# Patient Record
Sex: Male | Born: 1945 | Race: Black or African American | Hispanic: No | Marital: Single | Smoking: Current every day smoker
Health system: Southern US, Community
[De-identification: ages and names within clinical notes are randomized; demographics above are authoritative.]

## PROBLEM LIST (undated history)

## (undated) DIAGNOSIS — E119 Type 2 diabetes mellitus without complications: Secondary | ICD-10-CM

## (undated) DIAGNOSIS — D509 Iron deficiency anemia, unspecified: Secondary | ICD-10-CM

## (undated) DIAGNOSIS — R066 Hiccough: Principal | ICD-10-CM

## (undated) DIAGNOSIS — C801 Malignant (primary) neoplasm, unspecified: Secondary | ICD-10-CM

## (undated) DIAGNOSIS — D759 Disease of blood and blood-forming organs, unspecified: Secondary | ICD-10-CM

## (undated) DIAGNOSIS — I10 Essential (primary) hypertension: Secondary | ICD-10-CM

## (undated) DIAGNOSIS — I8290 Acute embolism and thrombosis of unspecified vein: Secondary | ICD-10-CM

## (undated) HISTORY — PX: OTHER SURGICAL HISTORY: SHX169

## (undated) HISTORY — DX: Iron deficiency anemia, unspecified: D50.9

## (undated) HISTORY — DX: Hiccough: R06.6

---

## 2014-01-30 ENCOUNTER — Emergency Department: Payer: Self-pay | Admitting: Internal Medicine

## 2014-01-30 ENCOUNTER — Inpatient Hospital Stay: Payer: Self-pay | Admitting: Internal Medicine

## 2014-01-30 LAB — URINALYSIS, COMPLETE
BACTERIA: NONE SEEN
BILIRUBIN, UR: NEGATIVE
BLOOD: NEGATIVE
KETONE: NEGATIVE
Leukocyte Esterase: NEGATIVE
Nitrite: NEGATIVE
PROTEIN: NEGATIVE
Ph: 6 (ref 4.5–8.0)
SPECIFIC GRAVITY: 1.01 (ref 1.003–1.030)
Squamous Epithelial: NONE SEEN
WBC UR: 1 /HPF (ref 0–5)

## 2014-01-30 LAB — COMPREHENSIVE METABOLIC PANEL
ALT: 17 U/L (ref 12–78)
ANION GAP: 3 — AB (ref 7–16)
Albumin: 3.7 g/dL (ref 3.4–5.0)
Alkaline Phosphatase: 92 U/L
BILIRUBIN TOTAL: 0.2 mg/dL (ref 0.2–1.0)
BUN: 14 mg/dL (ref 7–18)
CALCIUM: 9.1 mg/dL (ref 8.5–10.1)
Chloride: 102 mmol/L (ref 98–107)
Co2: 28 mmol/L (ref 21–32)
Creatinine: 0.88 mg/dL (ref 0.60–1.30)
EGFR (Non-African Amer.): >60
GLUCOSE: 164 mg/dL — AB (ref 65–99)
Osmolality: 270 (ref 275–301)
Potassium: 4.2 mmol/L (ref 3.5–5.1)
SGOT(AST): 18 U/L (ref 15–37)
Sodium: 133 mmol/L — ABNORMAL LOW (ref 136–145)
TOTAL PROTEIN: 7.7 g/dL (ref 6.4–8.2)

## 2014-01-30 LAB — CBC
HCT: 47.5 % (ref 40.0–52.0)
HGB: 15.3 g/dL (ref 13.0–18.0)
MCH: 25.7 pg — AB (ref 26.0–34.0)
MCHC: 32.3 g/dL (ref 32.0–36.0)
MCV: 80 fL (ref 80–100)
Platelet: 236 10*3/uL (ref 150–440)
RBC: 5.96 10*6/uL — AB (ref 4.40–5.90)
RDW: 14.8 % — AB (ref 11.5–14.5)
WBC: 7.1 10*3/uL (ref 3.8–10.6)

## 2014-01-30 LAB — LIPID PANEL
Cholesterol: 193 mg/dL (ref 0–200)
HDL: 26 mg/dL — AB (ref 40–60)
LDL CHOLESTEROL, CALC: 125 mg/dL — AB (ref 0–100)
Triglycerides: 211 mg/dL — ABNORMAL HIGH (ref 0–200)
VLDL Cholesterol, Calc: 42 mg/dL — ABNORMAL HIGH (ref 5–40)

## 2014-01-30 LAB — TROPONIN I: Troponin-I: 0.05 ng/mL

## 2014-01-31 LAB — CBC WITH DIFFERENTIAL/PLATELET
Basophil #: 0.1 10*3/uL (ref 0.0–0.1)
Basophil %: 1.3 %
EOS ABS: 0.6 10*3/uL (ref 0.0–0.7)
EOS PCT: 7.1 %
HCT: 44.9 % (ref 40.0–52.0)
HGB: 14.5 g/dL (ref 13.0–18.0)
LYMPHS ABS: 2.2 10*3/uL (ref 1.0–3.6)
Lymphocyte %: 26.7 %
MCH: 25.7 pg — ABNORMAL LOW (ref 26.0–34.0)
MCHC: 32.3 g/dL (ref 32.0–36.0)
MCV: 80 fL (ref 80–100)
MONO ABS: 0.7 x10 3/mm (ref 0.2–1.0)
MONOS PCT: 8.1 %
NEUTROS ABS: 4.8 10*3/uL (ref 1.4–6.5)
Neutrophil %: 56.8 %
PLATELETS: 238 10*3/uL (ref 150–440)
RBC: 5.65 10*6/uL (ref 4.40–5.90)
RDW: 15.1 % — ABNORMAL HIGH (ref 11.5–14.5)
WBC: 8.4 10*3/uL (ref 3.8–10.6)

## 2014-01-31 LAB — BASIC METABOLIC PANEL
Anion Gap: 5 — ABNORMAL LOW (ref 7–16)
BUN: 16 mg/dL (ref 7–18)
CO2: 27 mmol/L (ref 21–32)
Calcium, Total: 8.9 mg/dL (ref 8.5–10.1)
Chloride: 103 mmol/L (ref 98–107)
Creatinine: 0.9 mg/dL (ref 0.60–1.30)
EGFR (African American): >60
EGFR (Non-African Amer.): >60
Glucose: 101 mg/dL — ABNORMAL HIGH (ref 65–99)
OSMOLALITY: 271 (ref 275–301)
Potassium: 3.5 mmol/L (ref 3.5–5.1)
Sodium: 135 mmol/L — ABNORMAL LOW (ref 136–145)

## 2014-01-31 LAB — LIPID PANEL
CHOLESTEROL: 174 mg/dL (ref 0–200)
HDL Cholesterol: 31 mg/dL — ABNORMAL LOW (ref 40–60)
LDL CHOLESTEROL, CALC: 114 mg/dL — AB (ref 0–100)
TRIGLYCERIDES: 144 mg/dL (ref 0–200)
VLDL CHOLESTEROL, CALC: 29 mg/dL (ref 5–40)

## 2014-01-31 LAB — HEMOGLOBIN A1C: HEMOGLOBIN A1C: 8.2 % — AB (ref 4.2–6.3)

## 2014-01-31 LAB — TSH: Thyroid Stimulating Horm: 0.085 u[IU]/mL — ABNORMAL LOW

## 2015-03-05 NOTE — H&P (Signed)
PATIENT NAME:  Nathaniel Graham, Nathaniel Graham MR#:  [id number] DATE OF BIRTH:  1946-08-25  DATE OF ADMISSION:  01/30/2014  ADDENDUM:  I did dictate this afternoon an initial consultation for Mr. Jakarius Flamenco, BO#[id number]. The patient presented with blurred vision, elevated blood pressure yesterday at the drugstore. He actually left AMA. The patient left AMA due to some personal situation. He is now back and asking for admission. The patient had a CT scan which was concerning for an acute CVA versus some other neurological processes though.   ASSESSMENT AND PLAN: As follows:  A 69 year old male with history of hypertension and diabetes who is not taking any medications for the past 18 months as well as tobacco dependence who is not interested in quitting smoking  who presented initially with blurred vision and elevated blood pressure here with an abnormal CT scan of the head.   1. Acute cerebrovascular accident. The patient's may have had an acute CVA. His blood pressure apparently was over 585 systolic yesterday, 1 day prior to admission and he complains of blurred vision. CT scan is concerning for possible acute  CVA. I have ordered an MRI, carotid Dopplers, and echocardiogram, neurochecks every 4 hours, aspirin and statin, and lipid panel. If necessary, we may consider neurology consultation, but, I rather have the MRI before consulting neurology.  2. Hypertension. We will allow permissive hypertension at this time due to acute CVA. We will go ahead write hydralazine as needed.  3. Diabetes. The patient is not taking any medications for over 18 months. I have ordered an A1c sliding-scale insulin. The patient will need diabetes medication I am sure of it prior to discharge.  4. Tobacco dependence. The patient was already counseled when I saw him initially.  5. The patient will need a PCP prior to discharge.   TIME SPENT: Again on this plan of care was about 30 minutes.     ____________________________ Donell Beers. Benjie Karvonen, MD spm:[id number] D: 01/30/2014 20:16:44 ET T: 01/30/2014 20:45:38 ET JOB#: [id number]  cc: Zyonna Vardaman P. Benjie Karvonen, MD, <Dictator> Donell Beers Tula Schryver MD ELECTRONICALLY SIGNED 01/31/2014 12:21

## 2015-03-05 NOTE — Consult Note (Signed)
PATIENT NAME:  Nathaniel Graham, Nathaniel Graham MR#:  [id number] DATE OF BIRTH:  Aug 19, 1946  DATE OF CONSULTATION:  01/30/2014  CONSULTING PHYSICIAN:  Jhase Creppel P. Benjie Karvonen, MD  ADDENDUM The patient does smoke cigarettes, 1 pack a day. The patient was counseled against smoking tobacco. The patient says that he is not going to quit smoking. He is not interested in quitting smoking. The patient was counseled for 3 minutes.    ____________________________ Donell Beers. Benjie Karvonen, MD spm:mr D: 01/30/2014 16:52:54 ET T: 01/30/2014 19:51:04 ET JOB#: [id number]  cc: Yesena Reaves P. Benjie Karvonen, MD, <Dictator> Donell Beers Jatziry Wechter MD ELECTRONICALLY SIGNED 01/30/2014 20:33

## 2015-03-05 NOTE — Discharge Summary (Signed)
Dates of Admission and Diagnosis:  Date of Admission 30-Jan-2014   Date of Discharge 31-Jan-2014   Admitting Diagnosis CVA   Final Diagnosis CVA Htn DM Hyperlipidemia Smoking    Chief Complaint/History of Present Illness a 69 year old male who took his blood pressure yesterday at a pharmacy. Blood pressure was systolic over 330, and he had blurred vision and was feeling lightheaded so came here for further evaluation. In the ER, his blood pressure actually was 118/54. The patient, since his symptoms are improving, he wants to leave [place].   he came back to ER later same day- to get admitted.   Allergies:  No Known Allergies:   Thyroid:  22-Mar-15 03:44   Thyroid Stimulating Hormone  0.085 (0.45-4.50 (International Unit)  ----------------------- Pregnant patients have  different reference  ranges for TSH:  - - - - - - - - - -  Pregnant, first trimetser:  0.36 - 2.50 uIU/mL)  Cardiology:  22-Mar-15 08:44   Echo Doppler REASON FOR EXAM:     COMMENTS:     PROCEDURE: [place] - ECHO DOPPLER COMPLETE(TRANSTHOR)  - Jan 31 2014  8:44AM   RESULT: Echocardiogram Report  Patient Name:   Nathaniel Graham Date of Exam: 01/31/2014 Medical Rec #:  [id number]  Custom1: Date of Birth:  [birth year]               Height:       63.0 in Patient Age:    43 years                Weight:       162.0 lb Patient Gender: M                       BSA:          1.77 m??  Indications: CVA Sonographer:    LTM Referring Phys: MODY, SITAL, P  Sonographer Comments: Poor parasternal views due to patient's smoking  history.  Summary:  1. Left ventricular ejection fraction, by visual estimation, is 70 to  75%.No intracardiac thrombii seen.  2. Elevated mean left atrial pressure.  3. Impaired relaxation pattern of LV diastolic filling.  4. Normal right ventricular size and systolic function.  5. The left atrium is normal in size and structure.  6. The right atrium is normal in size  and structure.  7. Mild mitral valve regurgitation.  8. Mild to moderate aortic regurgitation.  9. Mild to moderate aortic valve sclerosis/calcification without any  evidence of aortic stenosis. 10. Mildly increased left ventricular posterior wall thickness. 2D AND M-MODE MEASUREMENTS (normal ranges within parentheses): Left Ventricle:          Normal   AoV Cusp Separation: 1.50 cm (1.5-2.6) IVSd (2D):      1.29 cm (0.7-1.1) LVPWd (2D):     1.26 cm (0.7-1.1) Aorta/LA:                  Normal LVIDd (2D):     3.69 cm (3.4-5.7) Aortic Root (2D): 2.80 cm (2.4-3.7) LVIDs (2D):     2.13 cm           AoV Cusp Exc:     1.50 cm (1.5-2.6) LV FS (2D):     42.3 %   (>25%) LV EF (2D):     74.1 %   (>50%)  Right Ventricle:                          RVd (2D): LV DIASTOLIC FUNCTION: MV Peak E: 0.74 m/s E/e' Ratio: 6.80 MV Peak A: 1.31 m/s Decel Time: 217 msec E/A Ratio: 0.57 SPECTRAL DOPPLER ANALYSIS (where applicable): Mitral Valve: MV Max Vel:   1.32 m/s MV P1/2 Time: 62.93 msec MV Mean Grad: 3.0 mmHg MV Area, PHT: 3.50 cm?? Aortic Valve: AoV Max Vel: 1.38 m/s AoV Peak PG: 7.6 mmHg AoV Mean PG: LVOT Vmax: 0.74 m/s LVOT VTI:  LVOT Diameter: 1.90 cm AoV Area, Vmax: 1.53 cm?? AoV Area, VTI:  AoV Area, Vmn: Pulmonic Valve: PV Max Velocity: 0.95 m/s PV Max PG: 3.6 mmHg PV Mean PG:  PHYSICIAN INTERPRETATION: Left Ventricle: The left ventricular internal cavity size was normal. LV  posterior wall thickness was mildly increased. Left ventricular ejection   fraction, by visual estimation, is 70 to 75%. Spectral Doppler shows  impaired relaxation pattern of LV diastolic filling. Elevated mean left  atrial pressure. Right Ventricle: Normal right ventricular size, wall thickness, and  systolic function. Left Atrium: The left atrium is normal in size and structure. Right Atrium: The right atrium is normal in size and structure. Mitral Valve: Mild mitral valve  regurgitation is seen. Tricuspid Valve: Trivial tricuspid regurgitation is visualized. Aortic Valve: Mild to moderate aortic valve sclerosis/calcification is  present, without any evidence of aortic stenosis. Mild to moderate aortic  valve regurgitation is seen.  [place] MD Electronically signed by [id number] [facility] Signature Date/Time: 01/31/2014/11:00:45 AM  *** Final ***  IMPRESSION: .    Verified By: Emmit Pomfret. Humphrey Rolls, M.D., MD  Routine Chem:  22-Mar-15 03:44   Glucose, Serum  101  BUN 16  Creatinine (comp) 0.90  Sodium, Serum  135  Potassium, Serum 3.5  Chloride, Serum 103  CO2, Serum 27  Calcium (Total), Serum 8.9  Anion Gap  5  Osmolality (calc) 271  eGFR (African American) >60  eGFR (Non-African American) >60 (eGFR values <47m/min/1.73 m2 may be an indication of chronic kidney disease (CKD). Calculated eGFR is useful in patients with stable renal function. The eGFR calculation will not be reliable in acutely ill patients when serum creatinine is changing rapidly. It is not useful in  patients on dialysis. The eGFR calculation may not be applicable to patients at the low and high extremes of body sizes, pregnant women, and vegetarians.)  Hemoglobin A1c ([facility])  8.2 (The American Diabetes Association recommends that a primary goal of therapy should be <7% and that physicians should reevaluate the treatment regimen in patients with HbA1c values consistently >8%.)  Cholesterol, Serum 174  Triglycerides, Serum 144  HDL (INHOUSE)  31  VLDL Cholesterol Calculated 29  LDL Cholesterol Calculated  114 (Result(s) reported on 31 Jan 2014 at 04:33AM.)  Routine Hem:  22-Mar-15 03:44   WBC (CBC) 8.4  RBC (CBC) 5.65  Hemoglobin (CBC) 14.5  Hematocrit (CBC) 44.9  Platelet Count (CBC) 238  MCV 80  MCH  25.7  MCHC 32.3  RDW  15.1  Neutrophil % 56.8  Lymphocyte % 26.7  Monocyte % 8.1  Eosinophil % 7.1  Basophil % 1.3  Neutrophil # 4.8  Lymphocyte # 2.2   Monocyte # 0.7  Eosinophil # 0.6  Basophil # 0.1 (Result(s) reported on 31 Jan 2014 at 04:37AM.)   PERTINENT RADIOLOGY STUDIES: [place]    22-Mar-15 07:59, [place] Doppler Bilateral  [place]  Doppler Bilateral   REASON FOR EXAM:    cva  COMMENTS:       PROCEDURE: [place]  - US CAROTID DOPPLER BILATERAL  - Jan 31 2014  7:59AM     CLINICAL DATA:  Stroke    EXAM:  BILATERAL CAROTID DUPLEX ULTRASOUND    TECHNIQUE:  Pearline Cables scale imaging, color Doppler and duplex ultrasound were  performed of bilateral carotid and vertebral arteries in the neck.    COMPARISON:  None.  FINDINGS:  Criteria: Quantification of carotid stenosis is based on velocity  parameters that correlate the residual internal carotid diameter  with NASCET-based stenosis levels, using the diameter of the distal  internal carotid lumen as the denominator for stenosis measurement.    The following velocity measurements were obtained:    RIGHT    ICA:  56 cm/sec    CCA:  94 cm/sec    SYSTOLIC ICA/CCA RATIO:  0.6  DIASTOLIC ICA/CCA RATIO:    ECA:  73 cm/sec    LEFT    ICA:  52 cm/sec    CCA:  94 cm/sec    SYSTOLIC ICA/CCA RATIO:  0.6    DIASTOLIC ICA/CCA RATIO:    ECA:  87 cm/sec  RIGHT CAROTID ARTERY: Little if any plaque in the bulb. Low  resistance internal carotid Doppler pattern.    RIGHT VERTEBRAL ARTERY:  Antegrade.  Normal Doppler pattern.    LEFT CAROTID ARTERY: Little if any plaque in the bulb. Low  resistance internal carotid Doppler pattern.    LEFT VERTEBRAL ARTERY:  Antegrade.  Normal Doppler pattern.     IMPRESSION:  Less than 50% stenosis in the right and left internal carotid  arteries.    Electronically Signed    By: Maryclare Bean M.D.    On: 01/31/2014 11:41         Verified By: Jamas Lav, M.D.,  CT:    21-Mar-15 14:50, CT Head Without Contrast  CT Head Without Contrast   REASON FOR EXAM:    blurred vision  COMMENTS:       PROCEDURE: CT  - CT HEAD WITHOUT CONTRAST  -  Jan 30 2014  2:50PM     CLINICAL DATA:  Dizziness    EXAM:  CT HEAD WITHOUT CONTRAST    TECHNIQUE:  Contiguous axial images were obtained from the base of the skull  through the vertex without intravenous contrast.    COMPARISON:  None.  FINDINGS:  Chronic ischemic changes. Atrophy. There is an area of low density  in the periventricular white matter superior to the right frontal  horn. There is subtle mass effect upon the adjacent frontal horn of  the lateral ventricular system. This may be within the corpus  callosum. Large occipital lipoma.     IMPRESSION:  There is a new low-density lesion in the white matter of the right  frontal lobe with mass effect upon the adjacent ventricle. This may  represent an acute infarct. Other white matter lesions should be  considered such is acute multiple sclerosis or neoplasm. MRI may be  helpful.    Electronically Signed    By: Maryclare Bean M.D.    On: 01/30/2014 15:17         Verified By: Jamas Lav, M.D.,   Pertinent Past History:  Pertinent Past History Htn DM   Hospital Course:  Hospital Course * CVA   confirmed by CT head.   Echo an carotid doppler done- non contributory results.  Can not do MRI due to braces wearing on left leg.   Back to baseline from functional point.    GIven  ASA, Statin. BP and Blood sugar control   Advised to follow regular with PMD.  * Htn   Lisinopril.  * DM   metformin and glipizide  * Hyperlipidemia   Statin.   * Smoking- councelled for 4 min.   Condition on Discharge Stable   Code Status:  Code Status Full Code   DISCHARGE INSTRUCTIONS HOME MEDS:  Medication Reconciliation: Patient's Home Medications at Discharge:     Medication Instructions  simvastatin 10 mg oral tablet  1 tab(s) orally once a day (at bedtime)   aspirin 81 mg oral delayed release tablet  1 tab(s) orally once a day   glucophage 500 mg oral tablet  1 tab(s) orally 2 times a day   glipizide 5 mg oral  tablet, extended release  1 tab(s) orally once a day   lisinopril 5 mg oral tablet  1 tab(s) orally once a day     Physician's Instructions:  Diet Low Sodium  Low Fat, Low Cholesterol  Carbohydrate Controlled (ADA) Diet   Activity Limitations As tolerated   Return to Work Not Applicable   Time frame for Follow Up Appointment 1-2 weeks  PMD   Electronic Signatures: Vaughan Basta (MD)  (Signed 26-Mar-15 17:33)  Authored: ADMISSION DATE AND DIAGNOSIS, CHIEF COMPLAINT/HPI, Allergies, PERTINENT LABS, PERTINENT RADIOLOGY STUDIES, PERTINENT PAST HISTORY, [place], PATIENT INSTRUCTIONS   Last Updated: 26-Mar-15 17:33 by Vaughan Basta (MD)

## 2015-03-05 NOTE — Consult Note (Signed)
PATIENT NAME:  Nathaniel Graham, Nathaniel Graham MR#:  [id number] DATE OF BIRTH:  Dec 09, 1945  DATE OF CONSULTATION:  01/30/2014  REFERRING PHYSICIAN:  Sheryl L. Benjaman Lobe, MD CONSULTING PHYSICIAN:  Nathaniel Talsma P. Benjie Karvonen, MD PRIMARY CARE PHYSICIAN: None.  IMPRESSION: 1.  Abnormal CT scan with a new low-density lesion in the white matter of the right frontal lobe with mass effect upon the adjacent ventricle.  2.  Hypertension.  3.  Diabetes.   PLAN: The patient wants to leave [place]. We have tried to convince the patient that he needs further work-up for this abnormal CAT scan, but the patient absolutely refuses. We spoke with him, Dr. Benjaman Lobe spoke with him, and family spoke with him. Therefore, my recommendation is that if the patient wants to leave [place], the patient has to sign [place] paperwork. I did discuss with the patient that this is an abnormal finding, and this could be an acute stroke or any other mass and could result in neurological deficits or even death. The patient expresses understanding and is able to make his own decisions and wants to leave [place].   HISTORY OF PRESENT ILLNESS: This is a 69 year old male who took his blood pressure yesterday at a pharmacy. Blood pressure was systolic over 562, and he had blurred vision and was feeling lightheaded so came here for further evaluation. In the ER, his blood pressure actually was 118/54. The patient, since his symptoms are improving, he wants to leave [place].   REVIEW OF SYSTEMS:    CONSTITUTIONAL: The patient denies any fever or fatigue.  EYES: Positive blurred vision. No double vision or glaucoma.  EARS, NOSE, THROAT: No ear pain, hearing loss, seasonal allergies, postnasal drip.  RESPIRATORY: No cough, wheezing, hemoptysis, COPD.  CARDIOVASCULAR: Denied any chest pain, palpitations, orthopnea, edema.  GASTROINTESTINAL: Denied any nausea, vomiting, diarrhea, abdominal  pain, melena or ulcers.  GENITOURINARY: No dysuria or hematuria.  ENDOCRINE: No polyuria or polydipsia. HEMATOLOGIC AND LYMPHATIC: No anemia or easy bruising.  SKIN: No rashes or lesions.  MUSCULOSKELETAL: No limited activity.  NEUROLOGIC: No history of CVA, TIA, or seizures.  PSYCHIATRIC: No history of anxiety or depression.   PAST MEDICAL HISTORY:  1.  Hypertension.  2.  Diabetes.   SOCIAL HISTORY: Positive 1 pack smoking a day. No alcohol or IV drug use.   MEDICATIONS: The patient has not been taking medications for over 18 months. He was taking medications for diabetes and hypertension.   PAST SURGICAL HISTORY: None.   FAMILY HISTORY: Positive for diabetes.   PHYSICAL EXAMINATION: VITAL SIGNS: Temperature 97.9, pulse 88, respirations 18, blood pressure 118/54, 97% on room air.  GENERAL: The patient is alert, oriented, not in acute distress.  HEENT: Head is atraumatic. Pupils are round and reactive. Sclerae anicteric. Mucous membranes are moist. Oropharynx is clear.  NECK: Supple without JVD, carotid bruit, or enlarged thyroid.  CARDIOVASCULAR: Regular rate and rhythm. No murmur, gallops, or rubs. PMI is not displaced.  LUNGS: Clear to auscultation without crackles, rales, rhonchi, or wheezing. Normal to percussion. ABDOMEN: Bowel sounds are positive. Nontender, nondistended. No hepatosplenomegaly.  EXTREMITIES: No clubbing, cyanosis, or edema. NEUROLOGIC: Cranial nerves II through XII are intact. There are no focal deficits. Cerebellar exam is normal. Gait is normal.  SKIN: Without rashes or lesions.   LABORATORY, DIAGNOSTIC AND RADIOLOGICAL DATA: Glucose 133, potassium 4.2, chloride 102, bicarbonate 28, BUN 14, creatinine 0.88, glucose 164, alkaline phosphatase 92, bilirubin 0.2, calcium 9.1, ALT 17,  AST 18, total protein 7.7, albumin 3.7. White blood cells 7.1, hemoglobin 15.3, hematocrit 45; platelets are 235. Troponin 0.05. Urinalysis shows no LCE or nitrites. LDL was 125,  cholesterol 193, HDL 26.   CT of the head shows new low-density lesion in the white matter of the right frontal lobe with mass effect upon the adjacent ventricle. This may represent an acute infarct. Other white matter lesions could be considered such as acute multiple sclerosis or neoplasm. MRI may be helpful.   Chest x-ray shows no acute cardiopulmonary disease.   EKG is normal sinus rhythm. No ST elevation or depression. He does have some left atrial enlargement.   Again, the patient did leave [place].   TIME SPENT: Approximately 65 minutes.   ____________________________ Nathaniel Beers. Benjie Karvonen, MD spm:jcm D: 01/30/2014 16:51:03 ET T: 01/30/2014 20:04:18 ET JOB#: [id number]  cc: Nathaniel Grimaldo P. Benjie Karvonen, MD, <Dictator> Nathaniel Beers Glennice Marcos MD ELECTRONICALLY SIGNED 01/30/2014 20:34

## 2015-12-02 ENCOUNTER — Telehealth: Payer: Self-pay

## 2015-12-02 NOTE — Telephone Encounter (Signed)
  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 Spoke with Mr Capulong on the phone. Again offered, as [facility] has done, to have EUS performed at another facility so it can be done sooner. He declined and wants it performed at [facility]. EUS has been scheduled for 12-15-15 with Dr Earlie Counts from [facility]. Went over instructions for EUS. Copy mailed to patient as well. He was instructed to stop asa 5 days prior and to not take metformin or any iron day of procedure. He does not have a medical oncologist and pt was educated that pending his results we would like to follow up with him.  INSTRUCTIONS FOR ENDOSCOPIC ULTRASOUND  -Your procedure has been scheduled for February 2nd with Dr Earlie Counts at [facility] will contact you to pre-register over the phone. If for any reason you have not received a call within one week prior to your scheduled procedure date, please call [phone]. -To get your scheduled arrival time, please call the Endoscopy unit at  [phone] between 1-3pm on: February 1st    -ON THE DAY OF YOU PROCEDURE:  1. If you are scheduled for a morning procedure, nothing to drink after midnight  -If you are scheduled for an afternoon procedure, you may have clear liquids until 5 hours prior  to the procedure but no carbonated drinks or broth  2. NO FOOD THE DAY OF YOUR PROCEDURE  3. You may take your heart, seizure, blood pressure, Parkinson's or  breathing medications at  6am with just enough water to get your pills down  4. Do not take any oral Diabetic medications the morning of your procedure.  5. Do not take Vitamins  6. Stop taking aspirin 5 days prior to procedure   -On the day of your procedure, come to the [facility] Admitting/Registration desk (First desk on the right) at the scheduled arrival time. You MUST have someone drive you home from your procedure. You must have a responsible adult with a valid drivers license who is on site throughout your entire procedure and who can stay with you for several hours after your procedure. You may not go home alone in a taxi, shuttle [place] or bus, as the drivers will not be responsible for you.  --If you have any questions please call me at the above contact

## 2015-12-14 ENCOUNTER — Encounter: Payer: Self-pay | Admitting: *Deleted

## 2015-12-15 ENCOUNTER — Ambulatory Visit
Admission: RE | Admit: 2015-12-15 | Discharge: 2015-12-15 | Disposition: A | Discharge status: Home / Self Care | Payer: Medicare HMO | Source: Ambulatory Visit | Attending: Gastroenterology | Admitting: Gastroenterology

## 2015-12-15 ENCOUNTER — Ambulatory Visit: Payer: Medicare HMO | Admitting: Anesthesiology

## 2015-12-15 ENCOUNTER — Encounter
Admission: RE | Disposition: A | Discharge status: Home / Self Care | Payer: Self-pay | Source: Ambulatory Visit | Attending: Gastroenterology

## 2015-12-15 DIAGNOSIS — C259 Malignant neoplasm of pancreas, unspecified: Secondary | ICD-10-CM | POA: Insufficient documentation

## 2015-12-15 DIAGNOSIS — Z7984 Long term (current) use of oral hypoglycemic drugs: Secondary | ICD-10-CM | POA: Insufficient documentation

## 2015-12-15 DIAGNOSIS — F17210 Nicotine dependence, cigarettes, uncomplicated: Secondary | ICD-10-CM | POA: Diagnosis not present

## 2015-12-15 DIAGNOSIS — E785 Hyperlipidemia, unspecified: Secondary | ICD-10-CM | POA: Insufficient documentation

## 2015-12-15 DIAGNOSIS — Z79899 Other long term (current) drug therapy: Secondary | ICD-10-CM | POA: Insufficient documentation

## 2015-12-15 DIAGNOSIS — K228 Other specified diseases of esophagus: Secondary | ICD-10-CM | POA: Insufficient documentation

## 2015-12-15 DIAGNOSIS — E041 Nontoxic single thyroid nodule: Secondary | ICD-10-CM | POA: Insufficient documentation

## 2015-12-15 DIAGNOSIS — E119 Type 2 diabetes mellitus without complications: Secondary | ICD-10-CM | POA: Insufficient documentation

## 2015-12-15 DIAGNOSIS — C787 Secondary malignant neoplasm of liver and intrahepatic bile duct: Secondary | ICD-10-CM | POA: Insufficient documentation

## 2015-12-15 DIAGNOSIS — I10 Essential (primary) hypertension: Secondary | ICD-10-CM | POA: Diagnosis not present

## 2015-12-15 DIAGNOSIS — I899 Noninfective disorder of lymphatic vessels and lymph nodes, unspecified: Secondary | ICD-10-CM | POA: Insufficient documentation

## 2015-12-15 DIAGNOSIS — R1909 Other intra-abdominal and pelvic swelling, mass and lump: Secondary | ICD-10-CM | POA: Diagnosis present

## 2015-12-15 HISTORY — DX: Type 2 diabetes mellitus without complications: E11.9

## 2015-12-15 HISTORY — DX: Essential (primary) hypertension: I10

## 2015-12-15 HISTORY — PX: UPPER ESOPHAGEAL ENDOSCOPIC ULTRASOUND (EUS): SHX6562

## 2015-12-15 LAB — GLUCOSE, CAPILLARY: Glucose-Capillary: 157 mg/dL — ABNORMAL HIGH (ref 65–99)

## 2015-12-15 SURGERY — UPPER ESOPHAGEAL ENDOSCOPIC ULTRASOUND (EUS)
Anesthesia: General

## 2015-12-15 MED ORDER — PROPOFOL 500 MG/50ML IV EMUL
INTRAVENOUS | Status: DC | PRN
  Administered 2015-12-15: 160 ug/kg/min via INTRAVENOUS

## 2015-12-15 MED ORDER — PHENYLEPHRINE HCL 10 MG/ML IJ SOLN
INTRAMUSCULAR | Status: DC | PRN
  Administered 2015-12-15: 100 ug via INTRAVENOUS
  Administered 2015-12-15: 50 ug via INTRAVENOUS
  Administered 2015-12-15: 100 ug via INTRAVENOUS

## 2015-12-15 MED ORDER — LIDOCAINE HCL (CARDIAC) 20 MG/ML IV SOLN
INTRAVENOUS | Status: DC | PRN
  Administered 2015-12-15: 60 mg via INTRAVENOUS

## 2015-12-15 MED ORDER — FENTANYL CITRATE (PF) 100 MCG/2ML IJ SOLN
INTRAMUSCULAR | Status: DC | PRN
  Administered 2015-12-15 (×2): 25 ug via INTRAVENOUS

## 2015-12-15 MED ORDER — SODIUM CHLORIDE 0.9 % IV SOLN
INTRAVENOUS | Status: DC
  Administered 2015-12-15: 13:00:00 via INTRAVENOUS

## 2015-12-15 NOTE — Anesthesia Postprocedure Evaluation (Signed)
Anesthesia Post Note  Patient: Nathaniel Graham.  Procedure(s) Performed: Procedure(s) (LRB): UPPER ESOPHAGEAL ENDOSCOPIC ULTRASOUND (EUS) (N/A)  Patient location during evaluation: PACU Anesthesia Type: General Level of consciousness: awake Pain management: satisfactory to patient Vital Signs Assessment: post-procedure vital signs reviewed and stable Respiratory status: nonlabored ventilation Cardiovascular status: stable Anesthetic complications: no    Last Vitals:  Filed Vitals:   12/15/15 1310  BP: 151/64  Pulse: 109  Temp: 36 C  Resp: 20    Last Pain:  Filed Vitals:   12/15/15 1312  PainSc: 7                  VAN STAVEREN,Portia Wisdom

## 2015-12-15 NOTE — Anesthesia Preprocedure Evaluation (Signed)
Anesthesia Evaluation  Patient identified by MRN, date of birth, ID band Patient awake    Reviewed: Allergy & Precautions, NPO status , Patient's Chart, lab work & pertinent test results  Airway Mallampati: II       Dental  (+) Edentulous Upper, Edentulous Lower   Pulmonary COPD, Current Smoker,    + rhonchi  + decreased breath sounds      Cardiovascular Exercise Tolerance: Good hypertension, Pt. on medications  Rhythm:Regular     Neuro/Psych    GI/Hepatic negative GI ROS, Neg liver ROS,   Endo/Other  diabetes, Well Controlled, Type 2, Oral Hypoglycemic Agents  Renal/GU      Musculoskeletal   Abdominal Normal abdominal exam  (+)   Peds  Hematology  (+) anemia ,   Anesthesia Other Findings   Reproductive/Obstetrics                             Anesthesia Physical Anesthesia Plan  ASA: III  Anesthesia Plan: General   Post-op Pain Management:    Induction: Intravenous  Airway Management Planned: Nasal Cannula  Additional Equipment:   Intra-op Plan:   Post-operative Plan:   Informed Consent: I have reviewed the patients History and Physical, chart, labs and discussed the procedure including the risks, benefits and alternatives for the proposed anesthesia with the patient or authorized representative who has indicated his/her understanding and acceptance.     Plan Discussed with: CRNA  Anesthesia Plan Comments:         Anesthesia Quick Evaluation

## 2015-12-15 NOTE — Transfer of Care (Signed)
Immediate Anesthesia Transfer of Care Note  Patient: Nathaniel Graham.  Procedure(s) Performed: Procedure(s): UPPER ESOPHAGEAL ENDOSCOPIC ULTRASOUND (EUS) (N/A)  Patient Location: PACU  Anesthesia Type:General  Level of Consciousness: sedated  Airway & Oxygen Therapy: Patient Spontanous Breathing and Patient connected to nasal cannula oxygen  Post-op Assessment: Report given to RN and Post -op Vital signs reviewed and stable  Post vital signs: Reviewed and stable  Last Vitals:  Filed Vitals:   12/15/15 1515 12/15/15 1525  BP: 92/54 110/63  Pulse: 95 94  Temp: 36.2 C   Resp: 23 18    Complications: No apparent anesthesia complications

## 2015-12-15 NOTE — Op Note (Signed)
[facility] Gastroenterology Patient Name: Nathaniel Graham Procedure Date: 12/15/2015 2:08 PM MRN: [id number] Account #: [id number] Date of Birth: 01-25-1946 Admit Type: Outpatient Age: 70 Room: [facility] ENDO ROOM 3 Gender: Male Note Status: Finalized Procedure:         Upper EUS Indications:       Suspected mass in liver on CT scan, Suspected mass in                     pancreas on CT scan Providers:         Christian Mate. Earlie Counts Referring MD:      Perrin Maltese, MD (Referring MD) Medicines:         Monitored Anesthesia Care Complications:     No immediate complications. Procedure:         Pre-Anesthesia Assessment:                    - Prior to the procedure, a History and Physical was                     performed, and patient medications, allergies and                     sensitivities were reviewed. The patient's tolerance of                     previous anesthesia was reviewed.                    After obtaining informed consent, the endoscope was passed                     under direct vision. Throughout the procedure, the                     patient's blood pressure, pulse, and oxygen saturations                     were monitored continuously. The EUS GI Linear Array                     [id number] was introduced through the mouth, and advanced to                     the second part of duodenum. The Endoscope was introduced                     through the mouth, and advanced to the second part of                     duodenum. The upper EUS was accomplished without                     difficulty. The patient tolerated the procedure well. Findings:      Endoscopic Finding :      A medium-sized, submucosal mass with no bleeding and with no stigmata of       recent bleeding was found in the lower third of the esophagus. The mass       was non-obstructing and not circumferential.      The entire examined stomach was endoscopically normal.      The examined duodenum  was endoscopically normal.      Endosonographic Finding :      A lobulated intramural (subepithelial)  lesion was found in the lower       third of the esophagus. It was encountered at 34 cm from the incisors.       The lesion was calcified and shadowing. It measured approximately 9.7 by       7.4 mm in cross sectional diameter. Sonographically, the origin appeared       to be within the intramural wall, but the wall layer could not be       determined. The endosonographic borders were poorly-defined.      Multiple round lesions were identified endosonographically in the left       lobe of the liver. The endosonographic appearance was suggestive of       metastases. The lesions were heterogenous. The largest lesion measured       34 mm by 29 mm in maximal cross-sectional diameter. The outer margins       were irregular. Fine needle aspiration for cytology was performed from a       representative lesion. Color Doppler imaging was utilized prior to       needle puncture to confirm a lack of significant vascular structures       within the needle path. Four passes were made with the 25 gauge needle       using a transgastric approach. Some passes were made with a stylet. A       cytologist was present and performed a preliminary cytologic       examination. Final cytology results are pending.      An irregular mass was identified in the pancreatic tail. The mass was       hypoechoic. The mass measured 49 mm by 45 mm in maximal cross-sectional       diameter. The outer margins were irregular. The remainder of the       pancreas was examined. The endosonographic appearance of parenchyma and       the upstream pancreatic duct indicated that the remainder of the       pancreas was unremarkable.      A few abnormal lymph nodes were visualized in the peripancreatic region.       The largest measured 11 mm by 10 mm in maximal cross-sectional diameter.       The nodes were round, hypoechoic and had  well defined margins. 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 Tissue was obtained from this exam, and                     results are pending. However, the endosonographic                     appearance is highly suspicious for metastatic pancreatic                     adenocarcinoma. Fine needle aspiration performed.                    - A mass was identified in the pancreatic tail. Tissue has                     not been obtained. However, the endosonographic appearance  is highly suspicious for adenocarcinoma.                    - A few abnormal lymph nodes were visualized in the                     peripancreatic region. Tissue has not been obtained.                     However, the endosonographic appearance is highly                     suspicious for metastatic pancreatic adenocarcinoma.                    - Calcified esophageal sumucosal lesion was found in the                     lower third of the esophagus. This could possibly be a                     metastasis but, given its calcified appearance, a benign                     lesion such as a calcified esophageal leiomyoma is favored. Recommendation:    - Await cytology results.                    - Patient has a contact number available for emergencies.                     The signs and symptoms of potential delayed complications                     were discussed with the patient. Return to normal                     activities tomorrow. Written discharge instructions were                     provided to the patient.                    - Return to referring physician. Procedure Code(s): --- Professional ---                    [phone], Esophagogastroduodenoscopy, flexible, transoral;                     with transendoscopic ultrasound-guided intramural or                     transmural fine needle aspiration/biopsy(s) (includes                      endoscopic ultrasound examination of the esophagus,                     stomach, and either the duodenum or a surgically altered                     stomach where the jejunum is examined distal to the                     anastomosis) Diagnosis Code(s): --- Professional ---  R93.3, Abnormal findings on diagnostic imaging of other                     parts of digestive tract                    R93.2, Abnormal findings on diagnostic imaging of liver                     and biliary tract                    K86.8, Other specified diseases of pancreas                    K22.8, Other specified diseases of esophagus                    C78.7, Secondary malignant neoplasm of liver and                     intrahepatic bile duct                    I89.9, Noninfective disorder of lymphatic vessels and                     lymph nodes, unspecified CPT copyright 2014 [employer]. All rights reserved. The codes documented in this report are preliminary and upon coder review may  be revised to meet current compliance requirements. Attending Participation:      I personally performed the entire procedure without the assistance of a       fellow, resident or surgical assistant. Lindie Spruce,  12/15/2015 3:21:51 PM This report has been signed electronically. Number of Addenda: 0 Note Initiated On: 12/15/2015 2:08 PM      [facility]

## 2015-12-16 ENCOUNTER — Telehealth: Payer: Self-pay

## 2015-12-16 ENCOUNTER — Encounter: Payer: Self-pay | Admitting: Gastroenterology

## 2015-12-16 LAB — CYTOLOGY - NON PAP

## 2015-12-16 NOTE — Telephone Encounter (Signed)
  Oncology Nurse Navigator Documentation  Navigator Location: [facility] (12/16/15 1600) Navigator Encounter Type: Telephone (12/16/15 1600)     Confirmed Diagnosis Date: 12/16/15 (12/16/15 1600)         Barriers/Navigation Needs: Coordination of Care (12/16/15 1600)   Interventions: Coordination of Care (12/16/15 1600)   Coordination of Care: Appts (12/16/15 1600)                  Time Spent with Patient: 30 (12/16/15 1600)   Pathology from EUS called to [place] at Dr Myrna Blazer office at [facility]. Oncology referral obtained and appt was made with Dr Rogue Bussing for 12/20/15 0900 at [facility] Nathaniel Graham was notified of appt with readback and given directions to the cancer center. I have provided him with my contact information for any questions or concerns.

## 2015-12-19 ENCOUNTER — Ambulatory Visit: Admission: RE | Admit: 2015-12-19 | Payer: Medicare HMO | Source: Ambulatory Visit | Admitting: Gastroenterology

## 2015-12-19 ENCOUNTER — Encounter: Admission: RE | Payer: Self-pay | Source: Ambulatory Visit

## 2015-12-19 SURGERY — COLONOSCOPY WITH PROPOFOL
Anesthesia: General

## 2015-12-20 ENCOUNTER — Telehealth: Payer: Self-pay | Admitting: *Deleted

## 2015-12-20 ENCOUNTER — Inpatient Hospital Stay: Payer: Medicare HMO | Attending: Internal Medicine | Admitting: Internal Medicine

## 2015-12-20 ENCOUNTER — Encounter: Payer: Self-pay | Admitting: *Deleted

## 2015-12-20 ENCOUNTER — Inpatient Hospital Stay: Payer: Medicare HMO

## 2015-12-20 ENCOUNTER — Other Ambulatory Visit: Payer: Self-pay | Admitting: *Deleted

## 2015-12-20 VITALS — BP 136/80 | HR 80 | Temp 97.2°F | Resp 20 | Ht 64.0 in | Wt 140.0 lb

## 2015-12-20 DIAGNOSIS — C787 Secondary malignant neoplasm of liver and intrahepatic bile duct: Secondary | ICD-10-CM

## 2015-12-20 DIAGNOSIS — R05 Cough: Secondary | ICD-10-CM

## 2015-12-20 DIAGNOSIS — C259 Malignant neoplasm of pancreas, unspecified: Secondary | ICD-10-CM

## 2015-12-20 DIAGNOSIS — M79601 Pain in right arm: Secondary | ICD-10-CM | POA: Insufficient documentation

## 2015-12-20 DIAGNOSIS — Z79899 Other long term (current) drug therapy: Secondary | ICD-10-CM | POA: Insufficient documentation

## 2015-12-20 DIAGNOSIS — D509 Iron deficiency anemia, unspecified: Secondary | ICD-10-CM | POA: Insufficient documentation

## 2015-12-20 DIAGNOSIS — Z5111 Encounter for antineoplastic chemotherapy: Secondary | ICD-10-CM | POA: Diagnosis present

## 2015-12-20 DIAGNOSIS — E119 Type 2 diabetes mellitus without complications: Secondary | ICD-10-CM | POA: Diagnosis not present

## 2015-12-20 DIAGNOSIS — Z7982 Long term (current) use of aspirin: Secondary | ICD-10-CM | POA: Insufficient documentation

## 2015-12-20 DIAGNOSIS — I10 Essential (primary) hypertension: Secondary | ICD-10-CM | POA: Insufficient documentation

## 2015-12-20 DIAGNOSIS — R109 Unspecified abdominal pain: Secondary | ICD-10-CM | POA: Insufficient documentation

## 2015-12-20 DIAGNOSIS — F17210 Nicotine dependence, cigarettes, uncomplicated: Secondary | ICD-10-CM | POA: Diagnosis not present

## 2015-12-20 DIAGNOSIS — Z7984 Long term (current) use of oral hypoglycemic drugs: Secondary | ICD-10-CM | POA: Insufficient documentation

## 2015-12-20 DIAGNOSIS — C252 Malignant neoplasm of tail of pancreas: Secondary | ICD-10-CM | POA: Insufficient documentation

## 2015-12-20 LAB — CBC WITH DIFFERENTIAL/PLATELET
BASOS ABS: 0.1 10*3/uL (ref 0–0.1)
Basophils Relative: 1 %
Eosinophils Absolute: 0.7 10*3/uL (ref 0–0.7)
Eosinophils Relative: 6 %
HCT: 26.2 % — ABNORMAL LOW (ref 40.0–52.0)
HEMOGLOBIN: 8.1 g/dL — AB (ref 13.0–18.0)
LYMPHS ABS: 1.5 10*3/uL (ref 1.0–3.6)
MCH: 21.8 pg — AB (ref 26.0–34.0)
MCHC: 30.9 g/dL — ABNORMAL LOW (ref 32.0–36.0)
MCV: 70.4 fL — AB (ref 80.0–100.0)
Monocytes Absolute: 0.7 10*3/uL (ref 0.2–1.0)
Monocytes Relative: 6 %
Neutro Abs: 8.2 10*3/uL — ABNORMAL HIGH (ref 1.4–6.5)
Platelets: 319 10*3/uL (ref 150–440)
RBC: 3.72 MIL/uL — AB (ref 4.40–5.90)
RDW: 15.1 % — ABNORMAL HIGH (ref 11.5–14.5)
WBC: 11.2 10*3/uL — AB (ref 3.8–10.6)

## 2015-12-20 LAB — PROTIME-INR
INR: 1.1 (ref 0.00–1.49)
Prothrombin Time: 14.4 seconds (ref 11.6–15.2)

## 2015-12-20 LAB — COMPREHENSIVE METABOLIC PANEL
ALK PHOS: 367 U/L — AB (ref 38–126)
ALT: 30 U/L (ref 17–63)
AST: 30 U/L (ref 15–41)
Albumin: 3.4 g/dL — ABNORMAL LOW (ref 3.5–5.0)
Anion gap: 9 (ref 5–15)
BUN: 14 mg/dL (ref 6–20)
CALCIUM: 9.3 mg/dL (ref 8.9–10.3)
CHLORIDE: 98 mmol/L — AB (ref 101–111)
CO2: 26 mmol/L (ref 22–32)
CREATININE: 0.74 mg/dL (ref 0.61–1.24)
Glucose, Bld: 224 mg/dL — ABNORMAL HIGH (ref 65–99)
Potassium: 4.3 mmol/L (ref 3.5–5.1)
SODIUM: 133 mmol/L — AB (ref 135–145)
Total Bilirubin: 0.6 mg/dL (ref 0.3–1.2)
Total Protein: 7.4 g/dL (ref 6.5–8.1)

## 2015-12-20 LAB — FERRITIN: Ferritin: 29 ng/mL (ref 24–336)

## 2015-12-20 LAB — APTT: APTT: 29 s (ref 24–36)

## 2015-12-20 LAB — IRON AND TIBC
Iron: 18 ug/dL — ABNORMAL LOW (ref 45–182)
Saturation Ratios: 5 % — ABNORMAL LOW (ref 17.9–39.5)
TIBC: 384 ug/dL (ref 250–450)
UIBC: 366 ug/dL

## 2015-12-20 MED ORDER — OXYCODONE-ACETAMINOPHEN 7.5-325 MG PO TABS: 1.0000 | ORAL_TABLET | Freq: Three times a day (TID) | ORAL | Status: DC | PRN

## 2015-12-20 MED ORDER — FENTANYL 25 MCG/HR TD PT72: 25.0000 ug | MEDICATED_PATCH | TRANSDERMAL | Status: DC

## 2015-12-20 NOTE — Progress Notes (Signed)
MD received report of ct scan that was performed in Jan 2017 at [facility]. The report states that a ct scan of chest, abd and pelvis was performed. Per MD order, sent msg to Mickel Baas in cancer center scheduling to cnl the ct scan of the chest. Mickel Baas will contact the patient and let him know that his test is not needed.

## 2015-12-20 NOTE — Patient Instructions (Addendum)
Pancreatic Cancer  Pancreatic cancer is an abnormal growth of tissue (tumor) in the pancreas that is cancerous (malignant). Unlike noncancerous (benign) tumors, malignant tumors can spread to other parts of your body. The pancreas is a gland located deep in the abdomen, between the stomach and the spine. The pancreas makes insulin and other hormones. These hormones help the body use or store the energy that comes from food. The pancreas also makes pancreatic juices. These juices contain enzymes that help digest food. Most pancreatic cancers begin in the ducts that carry pancreatic juices. When cancer of the pancreas spreads (metastasizes) outside the pancreas, cancer cells are often found in nearby lymph nodes. If the cancer has reached these nodes, it means that cancer cells may have spread to other lymph nodes or other tissues, such as the liver or lungs. Sometimes cancer of the pancreas spreads to the peritoneum. This is the tissue that lines the abdomen. CAUSES  The exact cause of pancreatic cancer is unknown.  RISK FACTORS There are a number of risk factors that can increase your chances of getting pancreatic cancer. They include:  Age. The likelihood of developing pancreatic cancer increases with age. Most pancreatic cancers occur in people older than 60 years.  Smoking. Cigarette smokers are 2 to 3 times more likely than nonsmokers to develop pancreatic cancer.  Diabetes, especially if you were diagnosed as an adult.  Being male.  Being African American.  Family history. The risk for developing pancreatic cancer triples if a person's mother, father, sister, or brother had the disease. Also, a family history of colon cancer or ovarian cancer increases the risk of pancreatic cancer.  Chronic pancreatitis. Chronic pancreatitis is a painful condition of the pancreas.  Exposure to certain chemicals in the workplace.  Being obese or eating a diet high in fat and red meat. SYMPTOMS    Pancreatic cancer is sometimes called a "silent disease." This is because early pancreatic cancer often does not cause symptoms. As the cancer grows, symptoms may include:  Weakness.  Abdominal pain.  Diarrhea.  Depression.  Loss of appetite.  Indigestion.  Pain in the upper abdomen or upper back.  Nausea and vomiting.  Yellowing of the skin or eyes (jaundice).  Back pain.  Weight loss.  Fatigue.  Clay-colored stools.  Unexplained blood clots.  Dark urine. DIAGNOSIS  Your caregiver will ask about your medical history. He or she may also perform a number of procedures, such as:  A physical exam. Your skin and eyes will be examined for signs of jaundice. The abdomen will be checked for changes in the area near the pancreas, liver, and gallbladder. Your caregiver will also check for an abnormal buildup of fluid in the abdomen (ascites).  Lab tests. Your caregiver may take blood and urine samples to check for bilirubin and other substances. Bilirubin is a substance that passes from the liver to the intestine through the gallbladder and bile duct. If the bile duct is blocked by a tumor, the bilirubin cannot pass through normally. Blockage of the bile duct may cause the level of bilirubin in the blood and urine to become very high.  Computed tomography (CT). An X-ray machine linked to a computer takes a series of detailed pictures of the pancreas and other organs and blood vessels in the abdomen.  Ultrasonography. The ultrasound device uses sound waves to produce pictures of the pancreas and other organs inside the abdomen.  Endoscopic retrograde cholangiopancreatography. A lighted tube (endoscope) is passed through the patient's  mouth and stomach, down into the small intestine. Then a smaller tube (catheter) is inserted through the endoscope into the bile ducts and pancreatic ducts. A dye is injected through the catheter into the ducts and X-rays are taken. The X-rays can show  whether the ducts are narrowed or blocked by a tumor.  Endoscopic ultrasonography. An endoscope is passed through the patient's mouth and stomach, down into the small intestine. An ultrasound device is placed down the endoscope to produce pictures of the area, including the pancreas.  Percutaneous transhepatic cholangiography. A dye is injected through a thin needle into the liver and X-rays are taken. Unless there is a blockage, the dye should move freely through the bile ducts. From the X-rays, your caregiver can tell whether there is a blockage from a tumor.  Taking a tissue sample (biopsy) from the pancreas. The sample is examined under a microscope to look for cancer cells. Your cancer will be staged according to its severity and extent. Staging is a careful attempt to categorize your cancer to help determine which treatment will be most appropriate. Factors involved in staging include the size of the tumor, whether the cancer has spread, and if so, to what parts of the body it has spread. You may need to have more tests to determine the stage of your cancer. The test results will help determine what treatment plan is best for you. STAGES   Stage I. The cancer is only found in the pancreas.  Stage II. The cancer has spread to nearby tissues and possibly to the lymph nodes, but not to the blood vessels.  Stage III. The cancer is surrounding the major blood vessels beside the pancreas and has possibly spread to the lymph nodes.  Stage IV. The cancer has spread to other parts of the body, such as the liver, lungs, or peritoneum. TREATMENT  Treatment generally begins within several weeks after the diagnosis. There will be time to talk with your caregiver about treatment choices, get a second opinion, and learn more about the disease. Your caregiver may refer you to a cancer specialist (oncologist).  Cancer of the pancreas is very hard to control with current treatments. For that reason, many  caregivers encourage patients with this disease to consider taking part in a clinical trial. Clinical trials are an important option for people with all stages of pancreatic cancer.  At this time, pancreatic cancer can be cured only when it is found at an early stage, before it has spread. However, other treatments may be able to control the disease and help patients live longer and feel better. When a cure or control of the disease is not possible, some patients choose palliative therapy. Palliative therapy aims to improve quality of life by controlling pain and other problems caused by this disease, but it does not cure the disease. Depending on the type and stage, pancreatic cancer may be treated with surgery, radiation therapy, or chemotherapy. Some patients have a combination of these therapies.  Surgery may be done to remove all or part of the pancreas. Sometimes the cancer cannot be completely removed. However, if the tumor is blocking the common bile duct or duodenum, the surgeon can place a mesh tube (stent) in the blocked area. This helps keep the duct or duodenum open.  Radiation therapy uses high-energy rays to kill cancer cells.  Chemotherapy is the use of drugs to kill cancer cells. Caregivers also give chemotherapy to help reduce pain and other problems caused by pancreatic  cancer. HOME CARE INSTRUCTIONS   Only take over-the-counter or prescription medicines for pain, discomfort, or fever as directed by your caregiver.  Maintain a healthy diet.  Consider joining a support group. This may help you learn to cope with the stress of having pancreatic cancer.  Seek advice to help you manage treatment side effects.  Keep all follow-up appointments as directed by your caregiver. SEEK IMMEDIATE MEDICAL CARE IF:   You have a sudden increase in pain.  Your skin or eyes turn more yellow.  You lose weight without trying.  You have a fever, especially during chemotherapy treatment or  after stent placement.  You notice new fatigue or weakness.   This information is not intended to replace advice given to you by your health care provider. Make sure you discuss any questions you have with your health care provider.   Document Released: 10/13/2004 Document Revised: 11/19/2014 Document Reviewed: 11/23/2011 Elsevier Interactive Patient Education 2016 [employer]        Gemcitabine injection What is this medicine? GEMCITABINE (jem SIT a been) is a chemotherapy drug. This medicine is used to treat many types of cancer like breast cancer, lung cancer, pancreatic cancer, and ovarian cancer. This medicine may be used for other purposes; ask your health care provider or pharmacist if you have questions. What should I tell my health care provider before I take this medicine? They need to know if you have any of these conditions: -blood disorders -infection -kidney disease -liver disease -recent or ongoing radiation therapy -an unusual or allergic reaction to gemcitabine, other chemotherapy, other medicines, foods, dyes, or preservatives -pregnant or trying to get pregnant -breast-feeding How should I use this medicine? This drug is given as an infusion into a vein. It is administered in a hospital or clinic by a specially trained health care professional. Talk to your pediatrician regarding the use of this medicine in children. Special care may be needed. Overdosage: If you think you have taken too much of this medicine contact a poison control center or emergency room at once. NOTE: This medicine is only for you. Do not share this medicine with others. What if I miss a dose? It is important not to miss your dose. Call your doctor or health care professional if you are unable to keep an appointment. What may interact with this medicine? -medicines to increase blood counts like filgrastim, pegfilgrastim, sargramostim -some other chemotherapy drugs like  cisplatin -vaccines Talk to your doctor or health care professional before taking any of these medicines: -acetaminophen -aspirin -ibuprofen -ketoprofen -naproxen This list may not describe all possible interactions. Give your health care provider a list of all the medicines, herbs, non-prescription drugs, or dietary supplements you use. Also tell them if you smoke, drink alcohol, or use illegal drugs. Some items may interact with your medicine. What should I watch for while using this medicine? Visit your doctor for checks on your progress. This drug may make you feel generally unwell. This is not uncommon, as chemotherapy can affect healthy cells as well as cancer cells. Report any side effects. Continue your course of treatment even though you feel ill unless your doctor tells you to stop. In some cases, you may be given additional medicines to help with side effects. Follow all directions for their use. Call your doctor or health care professional for advice if you get a fever, chills or sore throat, or other symptoms of a cold or flu. Do not treat yourself. This drug decreases your  body's ability to fight infections. Try to avoid being around people who are sick. This medicine may increase your risk to bruise or bleed. Call your doctor or health care professional if you notice any unusual bleeding. Be careful brushing and flossing your teeth or using a toothpick because you may get an infection or bleed more easily. If you have any dental work done, tell your dentist you are receiving this medicine. Avoid taking products that contain aspirin, acetaminophen, ibuprofen, naproxen, or ketoprofen unless instructed by your doctor. These medicines may hide a fever. Women should inform their doctor if they wish to become pregnant or think they might be pregnant. There is a potential for serious side effects to an unborn child. Talk to your health care professional or pharmacist for more information. Do  not breast-feed an infant while taking this medicine. What side effects may I notice from receiving this medicine? Side effects that you should report to your doctor or health care professional as soon as possible: -allergic reactions like skin rash, itching or hives, swelling of the face, lips, or tongue -low blood counts - this medicine may decrease the number of white blood cells, red blood cells and platelets. You may be at increased risk for infections and bleeding. -signs of infection - fever or chills, cough, sore throat, pain or difficulty passing urine -signs of decreased platelets or bleeding - bruising, pinpoint red spots on the skin, black, tarry stools, blood in the urine -signs of decreased red blood cells - unusually weak or tired, fainting spells, lightheadedness -breathing problems -chest pain -mouth sores -nausea and vomiting -pain, swelling, redness at site where injected -pain, tingling, numbness in the hands or feet -stomach pain -swelling of ankles, feet, hands -unusual bleeding Side effects that usually do not require medical attention (report to your doctor or health care professional if they continue or are bothersome): -constipation -diarrhea -hair loss -loss of appetite -stomach upset This list may not describe all possible side effects. Call your doctor for medical advice about side effects. You may report side effects to FDA at 1-800-FDA-1088. Where should I keep my medicine? This drug is given in a hospital or clinic and will not be stored at home. NOTE: This sheet is a summary. It may not cover all possible information. If you have questions about this medicine, talk to your doctor, pharmacist, or health care provider.    2016, Elsevier/[employer] Standard. (2008-03-09 18:45:54)     Nanoparticle Albumin-Bound Paclitaxel injection What is this medicine? NANOPARTICLE ALBUMIN-BOUND PACLITAXEL (Na no PAHR ti kuhl al BYOO muhn-bound PAK li TAX el) is a  chemotherapy drug. It targets fast dividing cells, like cancer cells, and causes these cells to die. This medicine is used to treat advanced breast cancer and advanced lung cancer. This medicine may be used for other purposes; ask your health care provider or pharmacist if you have questions. What should I tell my health care provider before I take this medicine? They need to know if you have any of these conditions: -kidney disease -liver disease -low blood counts, like low platelets, red blood cells, or white blood cells -recent or ongoing radiation therapy -an unusual or allergic reaction to paclitaxel, albumin, other chemotherapy, other medicines, foods, dyes, or preservatives -pregnant or trying to get pregnant -breast-feeding How should I use this medicine? This drug is given as an infusion into a vein. It is administered in a hospital or clinic by a specially trained health care professional. Talk to your pediatrician regarding  the use of this medicine in children. Special care may be needed. Overdosage: If you think you have taken too much of this medicine contact a poison control center or emergency room at once. NOTE: This medicine is only for you. Do not share this medicine with others. What if I miss a dose? It is important not to miss your dose. Call your doctor or health care professional if you are unable to keep an appointment. What may interact with this medicine? -cyclosporine -diazepam -ketoconazole -medicines to increase blood counts like filgrastim, pegfilgrastim, sargramostim -other chemotherapy drugs like cisplatin, doxorubicin, epirubicin, etoposide, teniposide, vincristine -quinidine -testosterone -vaccines -verapamil Talk to your doctor or health care professional before taking any of these medicines: -acetaminophen -aspirin -ibuprofen -ketoprofen -naproxen This list may not describe all possible interactions. Give your health care provider a list of all  the medicines, herbs, non-prescription drugs, or dietary supplements you use. Also tell them if you smoke, drink alcohol, or use illegal drugs. Some items may interact with your medicine. What should I watch for while using this medicine? Your condition will be monitored carefully while you are receiving this medicine. You will need important blood work done while you are taking this medicine. This drug may make you feel generally unwell. This is not uncommon, as chemotherapy can affect healthy cells as well as cancer cells. Report any side effects. Continue your course of treatment even though you feel ill unless your doctor tells you to stop. In some cases, you may be given additional medicines to help with side effects. Follow all directions for their use. Call your doctor or health care professional for advice if you get a fever, chills or sore throat, or other symptoms of a cold or flu. Do not treat yourself. This drug decreases your body's ability to fight infections. Try to avoid being around people who are sick. This medicine may increase your risk to bruise or bleed. Call your doctor or health care professional if you notice any unusual bleeding. Be careful brushing and flossing your teeth or using a toothpick because you may get an infection or bleed more easily. If you have any dental work done, tell your dentist you are receiving this medicine. Avoid taking products that contain aspirin, acetaminophen, ibuprofen, naproxen, or ketoprofen unless instructed by your doctor. These medicines may hide a fever. Do not become pregnant while taking this medicine. Women should inform their doctor if they wish to become pregnant or think they might be pregnant. There is a potential for serious side effects to an unborn child. Talk to your health care professional or pharmacist for more information. Do not breast-feed an infant while taking this medicine. Men are advised not to father a child while  receiving this medicine. What side effects may I notice from receiving this medicine? Side effects that you should report to your doctor or health care professional as soon as possible: -allergic reactions like skin rash, itching or hives, swelling of the face, lips, or tongue -low blood counts - This drug may decrease the number of white blood cells, red blood cells and platelets. You may be at increased risk for infections and bleeding. -signs of infection - fever or chills, cough, sore throat, pain or difficulty passing urine -signs of decreased platelets or bleeding - bruising, pinpoint red spots on the skin, black, tarry stools, nosebleeds -signs of decreased red blood cells - unusually weak or tired, fainting spells, lightheadedness -breathing problems -changes in vision -chest pain -high or low  blood pressure -mouth sores -nausea and vomiting -pain, swelling, redness or irritation at the injection site -pain, tingling, numbness in the hands or feet -slow or irregular heartbeat -swelling of the ankle, feet, hands Side effects that usually do not require medical attention (report to your doctor or health care professional if they continue or are bothersome): -aches, pains -changes in the color of fingernails -diarrhea -hair loss -loss of appetite This list may not describe all possible side effects. Call your doctor for medical advice about side effects. You may report side effects to FDA at 1-800-FDA-1088. Where should I keep my medicine? This drug is given in a hospital or clinic and will not be stored at home. NOTE: This sheet is a summary. It may not cover all possible information. If you have questions about this medicine, talk to your doctor, pharmacist, or health care provider.    2016, Elsevier/[employer] Standard. (2012-12-22 16:48:50)

## 2015-12-20 NOTE — Telephone Encounter (Signed)
RN Advice worker. Referral initiated for port placement. Left msg for nurse to contact cancer center to arrange. Asked if port could be placed on Friday 12/23/15 or Monday 12/26/15.

## 2015-12-20 NOTE — Patient Instructions (Signed)
Nanoparticle Albumin-Bound Paclitaxel injection  What is this medicine?  NANOPARTICLE ALBUMIN-BOUND PACLITAXEL (Na no PAHR ti kuhl al BYOO muhn-bound PAK li TAX el) is a chemotherapy drug. It targets fast dividing cells, like cancer cells, and causes these cells to die. This medicine is used to treat advanced breast cancer and advanced lung cancer.  This medicine may be used for other purposes; ask your health care provider or pharmacist if you have questions.  What should I tell my health care provider before I take this medicine?  They need to know if you have any of these conditions:  -kidney disease  -liver disease  -low blood counts, like low platelets, red blood cells, or white blood cells  -recent or ongoing radiation therapy  -an unusual or allergic reaction to paclitaxel, albumin, other chemotherapy, other medicines, foods, dyes, or preservatives  -pregnant or trying to get pregnant  -breast-feeding  How should I use this medicine?  This drug is given as an infusion into a vein. It is administered in a hospital or clinic by a specially trained health care professional.  Talk to your pediatrician regarding the use of this medicine in children. Special care may be needed.  Overdosage: If you think you have taken too much of this medicine contact a poison control center or emergency room at once.  NOTE: This medicine is only for you. Do not share this medicine with others.  What if I miss a dose?  It is important not to miss your dose. Call your doctor or health care professional if you are unable to keep an appointment.  What may interact with this medicine?  -cyclosporine  -diazepam  -ketoconazole  -medicines to increase blood counts like filgrastim, pegfilgrastim, sargramostim  -other chemotherapy drugs like cisplatin, doxorubicin, epirubicin, etoposide, teniposide, vincristine  -quinidine  -testosterone  -vaccines  -verapamil  Talk to your doctor or health care professional before taking any of these  medicines:  -acetaminophen  -aspirin  -ibuprofen  -ketoprofen  -naproxen  This list may not describe all possible interactions. Give your health care provider a list of all the medicines, herbs, non-prescription drugs, or dietary supplements you use. Also tell them if you smoke, drink alcohol, or use illegal drugs. Some items may interact with your medicine.  What should I watch for while using this medicine?  Your condition will be monitored carefully while you are receiving this medicine. You will need important blood work done while you are taking this medicine.  This drug may make you feel generally unwell. This is not uncommon, as chemotherapy can affect healthy cells as well as cancer cells. Report any side effects. Continue your course of treatment even though you feel ill unless your doctor tells you to stop.  In some cases, you may be given additional medicines to help with side effects. Follow all directions for their use.  Call your doctor or health care professional for advice if you get a fever, chills or sore throat, or other symptoms of a cold or flu. Do not treat yourself. This drug decreases your body's ability to fight infections. Try to avoid being around people who are sick.  This medicine may increase your risk to bruise or bleed. Call your doctor or health care professional if you notice any unusual bleeding.  Be careful brushing and flossing your teeth or using a toothpick because you may get an infection or bleed more easily. If you have any dental work done, tell your dentist you are receiving this   medicine.  Avoid taking products that contain aspirin, acetaminophen, ibuprofen, naproxen, or ketoprofen unless instructed by your doctor. These medicines may hide a fever.  Do not become pregnant while taking this medicine. Women should inform their doctor if they wish to become pregnant or think they might be pregnant. There is a potential for serious side effects to an unborn child. Talk to  your health care professional or pharmacist for more information. Do not breast-feed an infant while taking this medicine.  Men are advised not to father a child while receiving this medicine.  What side effects may I notice from receiving this medicine?  Side effects that you should report to your doctor or health care professional as soon as possible:  -allergic reactions like skin rash, itching or hives, swelling of the face, lips, or tongue  -low blood counts - This drug may decrease the number of white blood cells, red blood cells and platelets. You may be at increased risk for infections and bleeding.  -signs of infection - fever or chills, cough, sore throat, pain or difficulty passing urine  -signs of decreased platelets or bleeding - bruising, pinpoint red spots on the skin, black, tarry stools, nosebleeds  -signs of decreased red blood cells - unusually weak or tired, fainting spells, lightheadedness  -breathing problems  -changes in vision  -chest pain  -high or low blood pressure  -mouth sores  -nausea and vomiting  -pain, swelling, redness or irritation at the injection site  -pain, tingling, numbness in the hands or feet  -slow or irregular heartbeat  -swelling of the ankle, feet, hands  Side effects that usually do not require medical attention (report to your doctor or health care professional if they continue or are bothersome):  -aches, pains  -changes in the color of fingernails  -diarrhea  -hair loss  -loss of appetite  This list may not describe all possible side effects. Call your doctor for medical advice about side effects. You may report side effects to FDA at 1-800-FDA-1088.  Where should I keep my medicine?  This drug is given in a hospital or clinic and will not be stored at home.  NOTE: This sheet is a summary. It may not cover all possible information. If you have questions about this medicine, talk to your doctor, pharmacist, or health care provider.      2016, Elsevier/Gold Standard.  (2012-12-22 16:48:50)  Gemcitabine injection  What is this medicine?  GEMCITABINE (jem SIT a been) is a chemotherapy drug. This medicine is used to treat many types of cancer like breast cancer, lung cancer, pancreatic cancer, and ovarian cancer.  This medicine may be used for other purposes; ask your health care provider or pharmacist if you have questions.  What should I tell my health care provider before I take this medicine?  They need to know if you have any of these conditions:  -blood disorders  -infection  -kidney disease  -liver disease  -recent or ongoing radiation therapy  -an unusual or allergic reaction to gemcitabine, other chemotherapy, other medicines, foods, dyes, or preservatives  -pregnant or trying to get pregnant  -breast-feeding  How should I use this medicine?  This drug is given as an infusion into a vein. It is administered in a hospital or clinic by a specially trained health care professional.  Talk to your pediatrician regarding the use of this medicine in children. Special care may be needed.  Overdosage: If you think you have taken too much of this   medicine contact a poison control center or emergency room at once.  NOTE: This medicine is only for you. Do not share this medicine with others.  What if I miss a dose?  It is important not to miss your dose. Call your doctor or health care professional if you are unable to keep an appointment.  What may interact with this medicine?  -medicines to increase blood counts like filgrastim, pegfilgrastim, sargramostim  -some other chemotherapy drugs like cisplatin  -vaccines  Talk to your doctor or health care professional before taking any of these medicines:  -acetaminophen  -aspirin  -ibuprofen  -ketoprofen  -naproxen  This list may not describe all possible interactions. Give your health care provider a list of all the medicines, herbs, non-prescription drugs, or dietary supplements you use. Also tell them if you smoke, drink alcohol, or use  illegal drugs. Some items may interact with your medicine.  What should I watch for while using this medicine?  Visit your doctor for checks on your progress. This drug may make you feel generally unwell. This is not uncommon, as chemotherapy can affect healthy cells as well as cancer cells. Report any side effects. Continue your course of treatment even though you feel ill unless your doctor tells you to stop.  In some cases, you may be given additional medicines to help with side effects. Follow all directions for their use.  Call your doctor or health care professional for advice if you get a fever, chills or sore throat, or other symptoms of a cold or flu. Do not treat yourself. This drug decreases your body's ability to fight infections. Try to avoid being around people who are sick.  This medicine may increase your risk to bruise or bleed. Call your doctor or health care professional if you notice any unusual bleeding.  Be careful brushing and flossing your teeth or using a toothpick because you may get an infection or bleed more easily. If you have any dental work done, tell your dentist you are receiving this medicine.  Avoid taking products that contain aspirin, acetaminophen, ibuprofen, naproxen, or ketoprofen unless instructed by your doctor. These medicines may hide a fever.  Women should inform their doctor if they wish to become pregnant or think they might be pregnant. There is a potential for serious side effects to an unborn child. Talk to your health care professional or pharmacist for more information. Do not breast-feed an infant while taking this medicine.  What side effects may I notice from receiving this medicine?  Side effects that you should report to your doctor or health care professional as soon as possible:  -allergic reactions like skin rash, itching or hives, swelling of the face, lips, or tongue  -low blood counts - this medicine may decrease the number of white blood cells, red  blood cells and platelets. You may be at increased risk for infections and bleeding.  -signs of infection - fever or chills, cough, sore throat, pain or difficulty passing urine  -signs of decreased platelets or bleeding - bruising, pinpoint red spots on the skin, black, tarry stools, blood in the urine  -signs of decreased red blood cells - unusually weak or tired, fainting spells, lightheadedness  -breathing problems  -chest pain  -mouth sores  -nausea and vomiting  -pain, swelling, redness at site where injected  -pain, tingling, numbness in the hands or feet  -stomach pain  -swelling of ankles, feet, hands  -unusual bleeding  Side effects that usually do

## 2015-12-20 NOTE — Progress Notes (Signed)
[facility] NOTE  Patient Care Team: Perrin Maltese, MD as PCP - General (Internal Medicine) Clent Jacks, RN as Registered Nurse  Dr. Olivia Mackie Mclean-Scocuzza MD  CHIEF COMPLAINTS/PURPOSE OF CONSULTATION: Pancreatic cancer   ONCOLOGIC HISTORY:  # FEB 2017- PANCREATIC ADENO CA [s/p EUS-Liver bx];EUS- multiple liver lesions [left lobe 34x61mm]; Pancreatic tail mass [49x29mm];Jan2017-  CT C/A/P- 6cm pan tail mass; multiple liver lesions; Lungs- neg;   # Smoking/diabetes  HISTORY OF PRESENTING ILLNESS:  Nathaniel Graham. 70 y.o.  male  With recently diagnosed metastatic  Pancreatic cancer  Is here to review the treatment options.   Patient was recently seen by PCP/ GI-  In mid-January 2017-  With symptoms of weight loss nausea and abdominal pain.  Patient further workup noted to have a 6 cm mass in the pancreatic tail with multiple liver lesions.  He subsequently underwent EUS with biopsy of the liver lesion.  Findings summarized above.   he has lost about 8 pounds in the last 1 week. Poor appetite.  Intermittent nausea no vomiting.  Abdominal pain is intense all the time he is on 2 Percocets a day.  No  Unusualcough no unusual shortness of breath or chest pain.  Patient has chronic cough;  Not getting any worse.- he is a smoker.  He denies any tingling and numbness of his extremities.   ROS: A complete 10 point review of system is done which is negative except mentioned above in history of present illness  MEDICAL HISTORY:  Past Medical History  Diagnosis Date  . Hypertension   . Diabetes mellitus without complication ([place])   . Iron deficiency anemia   . Pancreatic cancer ([place]) 12/15/2015    SURGICAL HISTORY: Past Surgical History  Procedure Laterality Date  . Upper esophageal endoscopic ultrasound (eus) N/A 12/15/2015    Procedure: UPPER ESOPHAGEAL ENDOSCOPIC ULTRASOUND (EUS);  Surgeon: Cora Daniels, MD;  Location: [facility] ENDOSCOPY;  Service:  Endoscopy;  Laterality: N/A;  . Cataract surgery Bilateral     SOCIAL HISTORY: denies any alcohol. Lives with his significant other at home in [place]; used to work as Dealer.  Social History   Social History  . Marital Status: Single    Spouse Name: N/A  . Number of Children: N/A  . Years of Education: N/A   Occupational History  . Not on file.   Social History Main Topics  . Smoking status: Current Every Day Smoker -- 30 years    Types: Cigarettes  . Smokeless tobacco: Never Used     Comment: smokes 3-4 cigarettes a day "STARTED SMOKING LATE TEENS"  . Alcohol Use: No  . Drug Use: No  . Sexual Activity:    Partners: Female   Other Topics Concern  . Not on file   Social History Narrative    FAMILY HISTORY: Family History  Problem Relation Age of Onset  . Diabetes Mellitus II Mother   . Osteoarthritis Father     ALLERGIES:  has No Known Allergies.  MEDICATIONS:  Current Outpatient Prescriptions  Medication Sig Dispense Refill  . aspirin 81 MG tablet Take 81 mg by mouth daily.    . ferrous fumarate (HEMOCYTE - 106 MG FE) 325 (106 Fe) MG TABS tablet Take 1 tablet by mouth.    Marland Kitchen glimepiride (AMARYL) 1 MG tablet Take 1 tablet by mouth 2 (two) times daily.    Marland Kitchen lisinopril (PRINIVIL,ZESTRIL) 10 MG tablet Take 10 mg by mouth daily.    Marland Kitchen  metFORMIN (GLUCOPHAGE) 1000 MG tablet Take 1,000 mg by mouth 2 (two) times daily with a meal.    . oxyCODONE-acetaminophen (PERCOCET/ROXICET) 5-325 MG tablet Take 2 tablets by mouth daily.    . pantoprazole (PROTONIX) 40 MG tablet Take 40 mg by mouth daily.    . polyethylene glycol powder (GLYCOLAX/MIRALAX) powder Take 17 g by mouth as needed. FOR CONSTIPATION    . simvastatin (ZOCOR) 10 MG tablet Take 10 mg by mouth daily.    . fentaNYL (DURAGESIC - DOSED MCG/HR) 25 MCG/HR patch Place 1 patch (25 mcg total) onto the skin every 3 (three) days. 5 patch 0  . oxyCODONE-acetaminophen (PERCOCET) 7.5-325 MG tablet Take 1 tablet by mouth  every 8 (eight) hours as needed for severe pain. 90 tablet 0   No current facility-administered medications for this visit.      Marland Kitchen  PHYSICAL EXAMINATION: ECOG PERFORMANCE STATUS: 2 - Symptomatic, <50% confined to bed  Filed Vitals:   12/20/15 0933  BP: 136/80  Pulse: 80  Temp: 97.2 F (36.2 C)  Resp: 20   Filed Weights   12/20/15 0933  Weight: 139 lb 15.9 oz (63.5 kg)    GENERAL:  Thin built moderately nourished African-American male patient.  Mild distress because of pain. EYES: no pallor or icterus OROPHARYNX: no thrush or ulceration; good dentition  NECK: supple, no masses felt LYMPH:  no palpable lymphadenopathy in the cervical, axillary or inguinal regions LUNGS: clear to auscultation and  No wheeze or crackles HEART/CVS: regular rate & rhythm and no murmurs; No lower extremity edema ABDOMEN: abdomen soft, non-tender and normal bowel sounds Musculoskeletal:no cyanosis of digits and no clubbing  PSYCH: alert & oriented x 3 with fluent speech NEURO: no focal motor/sensory deficits SKIN:  no rashes or significant lesions  LABORATORY DATA:  I have reviewed the data as listed Lab Results  Component Value Date   WBC 8.4 01/31/2014   HGB 14.5 01/31/2014   HCT 44.9 01/31/2014   MCV 80 01/31/2014   PLT 238 01/31/2014   No results for input(s): NA, K, CL, CO2, GLUCOSE, BUN, CREATININE, CALCIUM, GFRNONAA, GFRAA, PROT, ALBUMIN, AST, ALT, ALKPHOS, BILITOT, BILIDIR, IBILI in the last 8760 hours.  RADIOGRAPHIC STUDIES: I have personally reviewed the radiological images as listed and agreed with the findings in the report. No results found.  ASSESSMENT & PLAN:   # METASTATIC PANCREATIC CANCER/Stage IV-  Reviewed the stage and pathology the patient and family in detail.  Given the stage IV cancer-  All the treatments are palliative;  Help with symptom control-  This is not curative.   I discussed the various chemotherapy options including FOLFIRINOX every 2 weeks #2  gemcitabine and Abraxane every 2 weeks. Discussed that the response rates with FOLFIRINOX is approximately 30%; disease control rate is about 70%; whereas with gemcitabine-Abraxane the response rates are about 20% to 25%; disease control rate is about 50%. Expectantly, the side effects of FOLFIRINOX are more intense- including more neutropenia; fatigue and diarrhea.  Given the patient's borderline performance status;  I'm concerned about  His poor tolerance to aggressive chemotherapy like FOLFIRINOX.  Patient is not a candidate for the clinical trial that uses  FOLFIRINOX as the backbone.  #  I think gemcitabine- Abraxane might be more tolerable.  Also recommend reevaluation with a CAT scan approximately after 4 cycles;  And further treatment is recommended his upon this post therapy.   I also discussed that if he has poor tolerance to therapy/ or progressive  disease-  best supportive care/ hospice would be recommended. He agrees.  #  Pain control-  I recommend  Adding fentanyl 25 g;  And also  Percocet  7.5/325 every 8 hours.  New prescriptions given.  #  Plan to start chemotherapy next week.  Check CBC CMP Ca19-9/ CRP today.  Patient will need chemotherapy education class.  Also discussed regarding Port. I reviewed the CT chest abdomen pelvis report; we will try to get the images loaded on the Epic.  Thank you Dr.Khan for allowing me to participate in the care of your pleasant patient. Please do not hesitate to contact me with questions or concerns in the interim.  # 60 minutes face-to-face with the patient discussing the above plan of care; more than 50% of time spent on prognosis/ natural history; counseling and coordination.     Cammie Sickle, MD 12/20/2015 10:19 AM

## 2015-12-21 ENCOUNTER — Other Ambulatory Visit: Payer: Self-pay | Admitting: Vascular Surgery

## 2015-12-21 ENCOUNTER — Other Ambulatory Visit: Payer: Self-pay | Admitting: Internal Medicine

## 2015-12-21 ENCOUNTER — Other Ambulatory Visit: Payer: Self-pay | Admitting: *Deleted

## 2015-12-21 DIAGNOSIS — C259 Malignant neoplasm of pancreas, unspecified: Secondary | ICD-10-CM | POA: Insufficient documentation

## 2015-12-21 DIAGNOSIS — C252 Malignant neoplasm of tail of pancreas: Secondary | ICD-10-CM

## 2015-12-21 DIAGNOSIS — D509 Iron deficiency anemia, unspecified: Secondary | ICD-10-CM | POA: Insufficient documentation

## 2015-12-21 DIAGNOSIS — Z95828 Presence of other vascular implants and grafts: Secondary | ICD-10-CM

## 2015-12-21 LAB — C-REACTIVE PROTEIN: CRP: 9.1 mg/dL — ABNORMAL HIGH (ref <1.0)

## 2015-12-21 LAB — CANCER ANTIGEN 19-9: CA 19 9: 2975 U/mL — AB (ref 0–35)

## 2015-12-21 MED ORDER — LIDOCAINE-PRILOCAINE 2.5-2.5 % EX CREA: 1.0000 "application " | TOPICAL_CREAM | CUTANEOUS | Status: DC | PRN

## 2015-12-21 NOTE — Telephone Encounter (Signed)
Patient is agreeable for port placement for Monday. He spoke with his caregiver, [name], and she can transport him to this appointment on Monday. I spoke with Mickel Baas at [facility]. She will arrange the procedure. The patient will need to be NPO 8 hrs prior to the procedure. He can not take his metformin 24 hrs prior to the port placement and 48 hours after the port placement. He should arrive at 1215 on Monday 12/26/15 for the procedure.  I spoke with the patient. He states that he can not write these instructions down. He is unable to write. He reports difficulty reading instructions and difficulty remembering facts and instructions. He asked that we speak to [facility] to provide these instructions to his caregiver so that he gets the instructions down correctly. Ms. [name] Scales is not available until 8 pm today; however, she will be off tomorrow. I explained to the patient that he is coming for the chemotherapy class tomorrow. He gave verbal understanding.  I explained to the patient that Dr. Rogue Bussing sent a prescription for EMLA cream to his [facility]. I explained that he would need to apply this cream to the port a cath site on Tuesday morning approximately an hour before accessing his port. The patient explained that [facility] was unable to pick up his prescriptions at [facility]. He could not afford the copayment. He doesn't know how much this would be as he did not go to the pharmacy himself. "I just know that it was very expensive." I contacted Elease Etienne in social work. I explained the identified financial barriers to patient's care. Barnabas Lister has approved coverage for the co-pay for the Duragesic patches and oral narcotics. At this time, the patient doesn't know if he will be able to afford the EMLA cream either. The patient most likely will need a new prescription for these drugs to be hand delivered to [facility]. The prescriptions at [facility] would also have to be cnl if narcotics are going to be picked up  at [facility]. I will speak to Dr. Rogue Bussing about this as well tommorrow. The patient and his significant other/caregiver, [employer], will be coming to the clinic tomorrow for the chemotherapy class. I will personally discuss the options with [facility] as well as the port placement instructions.   A personal verbal RN hand off was also provided to Magdalene Patricia the chemotherapy nurse, who will be teaching the patient's chemotherapy class.  Explained to [place] the identified pt education barriers to pt's care. Pt unable/struggles with reading/writing.

## 2015-12-21 NOTE — Telephone Encounter (Signed)
Confirmed with Mickel Baas at [facility] that referral was received. Vascular can put port in on Monday but pt told vascular department that "he would have to contact her back later."

## 2015-12-22 ENCOUNTER — Inpatient Hospital Stay: Payer: Medicare HMO

## 2015-12-22 MED ORDER — FENTANYL 25 MCG/HR TD PT72: 25.0000 ug | MEDICATED_PATCH | TRANSDERMAL | Status: DC

## 2015-12-22 MED ORDER — OXYCODONE-ACETAMINOPHEN 7.5-325 MG PO TABS: 1.0000 | ORAL_TABLET | Freq: Three times a day (TID) | ORAL | Status: DC | PRN

## 2015-12-22 NOTE — Telephone Encounter (Signed)
Spoke with [employer], pt's caregiver. The patient did not come to his chemotherapy class. Nathaniel Graham came on patient's behalf. I personally provided the care giver with the information about the port placement, arrival time, patient prep (NPO 8 hrs prior to procedure). I also explained the instructions for holding the metformin 24 hrs prior to the procedure and again 48 hrs post port placement.  I also explained to the patient's caregiver that MD prescribed EMLA cream. I explained the application process of the EMLA cream. Nathaniel Graham was also given the EMLA cream written application instructions. I also explained that IV iron was added to the infusion for next Tuesday as patient's labs demonstrated anemia.  The patient's caregiver was provided with a new RX for a Duragesic patch and percocet to be taken to [facility]. The prescription will be taken to [facility]. I called [facility] to cnl the the narcotic rx. [name] will let me know if the copay cost of th EMLA cream and we can revisit calling this drug into [facility]. I gave Nathaniel Graham the social worker's phone number per [name]. He will contact her to evaluate the patient's needs. The caregiver was appreciate of the time spent with her in discussing the patient's care. Teach back process performed with caregiver.

## 2015-12-23 ENCOUNTER — Ambulatory Visit: Payer: 59

## 2015-12-26 ENCOUNTER — Encounter
Admission: RE | Disposition: A | Discharge status: Home / Self Care | Payer: Self-pay | Source: Ambulatory Visit | Attending: Vascular Surgery

## 2015-12-26 ENCOUNTER — Telehealth: Payer: Self-pay | Admitting: *Deleted

## 2015-12-26 ENCOUNTER — Inpatient Hospital Stay
Admission: RE | Admit: 2015-12-26 | Discharge: 2015-12-26 | Disposition: A | Discharge status: Home / Self Care | Payer: Self-pay | Source: Ambulatory Visit | Attending: Oncology | Admitting: Oncology

## 2015-12-26 ENCOUNTER — Ambulatory Visit
Admission: RE | Admit: 2015-12-26 | Discharge: 2015-12-26 | Disposition: A | Discharge status: Home / Self Care | Payer: Medicare HMO | Source: Ambulatory Visit | Attending: Vascular Surgery | Admitting: Vascular Surgery

## 2015-12-26 ENCOUNTER — Encounter: Payer: Self-pay | Admitting: *Deleted

## 2015-12-26 ENCOUNTER — Other Ambulatory Visit: Payer: Self-pay | Admitting: Oncology

## 2015-12-26 DIAGNOSIS — Z7984 Long term (current) use of oral hypoglycemic drugs: Secondary | ICD-10-CM | POA: Insufficient documentation

## 2015-12-26 DIAGNOSIS — Z7982 Long term (current) use of aspirin: Secondary | ICD-10-CM | POA: Diagnosis not present

## 2015-12-26 DIAGNOSIS — D509 Iron deficiency anemia, unspecified: Secondary | ICD-10-CM | POA: Diagnosis not present

## 2015-12-26 DIAGNOSIS — C251 Malignant neoplasm of body of pancreas: Secondary | ICD-10-CM

## 2015-12-26 DIAGNOSIS — C259 Malignant neoplasm of pancreas, unspecified: Secondary | ICD-10-CM | POA: Diagnosis present

## 2015-12-26 DIAGNOSIS — F17210 Nicotine dependence, cigarettes, uncomplicated: Secondary | ICD-10-CM | POA: Insufficient documentation

## 2015-12-26 DIAGNOSIS — Z79899 Other long term (current) drug therapy: Secondary | ICD-10-CM | POA: Insufficient documentation

## 2015-12-26 DIAGNOSIS — I10 Essential (primary) hypertension: Secondary | ICD-10-CM | POA: Insufficient documentation

## 2015-12-26 DIAGNOSIS — E119 Type 2 diabetes mellitus without complications: Secondary | ICD-10-CM | POA: Insufficient documentation

## 2015-12-26 HISTORY — DX: Disease of blood and blood-forming organs, unspecified: D75.9

## 2015-12-26 HISTORY — PX: PERIPHERAL VASCULAR CATHETERIZATION: SHX172C

## 2015-12-26 SURGERY — PORTA CATH INSERTION
Anesthesia: Moderate Sedation

## 2015-12-26 MED ORDER — SODIUM CHLORIDE 0.9 % IR SOLN
Freq: Once | Status: DC
  Filled 2015-12-26: qty 2

## 2015-12-26 MED ORDER — MIDAZOLAM HCL 2 MG/2ML IJ SOLN
INTRAMUSCULAR | Status: DC | PRN
  Administered 2015-12-26 (×2): 2 mg via INTRAVENOUS
  Administered 2015-12-26: 1 mg

## 2015-12-26 MED ORDER — HYDROMORPHONE HCL 1 MG/ML IJ SOLN: 1.0000 mg | Freq: Once | INTRAMUSCULAR | Status: DC

## 2015-12-26 MED ORDER — FENTANYL CITRATE (PF) 100 MCG/2ML IJ SOLN
INTRAMUSCULAR | Status: DC | PRN
  Administered 2015-12-26 (×2): 50 ug via INTRAVENOUS

## 2015-12-26 MED ORDER — LIDOCAINE-EPINEPHRINE (PF) 1 %-1:200000 IJ SOLN
INTRAMUSCULAR | Status: AC
  Filled 2015-12-26: qty 30

## 2015-12-26 MED ORDER — SODIUM CHLORIDE 0.9 % IV SOLN
INTRAVENOUS | Status: DC
  Administered 2015-12-26: 13:00:00 via INTRAVENOUS

## 2015-12-26 MED ORDER — FENTANYL CITRATE (PF) 100 MCG/2ML IJ SOLN
INTRAMUSCULAR | Status: AC
  Filled 2015-12-26: qty 2

## 2015-12-26 MED ORDER — DEXTROSE 5 % IV SOLN
1.5000 g | INTRAVENOUS | Status: AC
  Administered 2015-12-26: 1.5 g via INTRAVENOUS

## 2015-12-26 MED ORDER — HEPARIN (PORCINE) IN NACL 2-0.9 UNIT/ML-% IJ SOLN
INTRAMUSCULAR | Status: AC
  Filled 2015-12-26: qty 500

## 2015-12-26 MED ORDER — ONDANSETRON HCL 4 MG/2ML IJ SOLN: 4.0000 mg | Freq: Four times a day (QID) | INTRAMUSCULAR | Status: DC | PRN

## 2015-12-26 MED ORDER — MIDAZOLAM HCL 5 MG/5ML IJ SOLN
INTRAMUSCULAR | Status: AC
  Filled 2015-12-26: qty 5

## 2015-12-26 SURGICAL SUPPLY — 10 items
BAG DECANTER STRL (MISCELLANEOUS) ×3 IMPLANT
KIT PORT POWER 8FR ISP CVUE (Catheter) ×3 IMPLANT
PACK ANGIOGRAPHY (CUSTOM PROCEDURE TRAY) ×3 IMPLANT
PAD GROUND ADULT SPLIT (MISCELLANEOUS) ×3 IMPLANT
PENCIL ELECTRO HAND CTR (MISCELLANEOUS) ×3 IMPLANT
PREP CHG 10.5 TEAL (MISCELLANEOUS) ×3 IMPLANT
SUT MNCRL AB 4-0 PS2 18 (SUTURE) ×3 IMPLANT
SUT PROLENE 0 CT 1 30 (SUTURE) ×3 IMPLANT
SUTURE VIC 3-0 (SUTURE) ×3 IMPLANT
TOWEL OR 17X26 4PK STRL BLUE (TOWEL DISPOSABLE) ×3 IMPLANT

## 2015-12-26 NOTE — Telephone Encounter (Signed)
Pt's caregiver called cancer center. Call returned. Caregiver with patient now in the medical mall in registration to register for port placement. She is inquiring about Nathaniel Graham's appointments tomorrow in [facility]. I reviewed that she needs to arrive at 915am for md appointment. She also states that she was able to pick up the EMLA cream and did not need copay assistance for this drug.

## 2015-12-26 NOTE — H&P (Signed)
  [facility] History & Physical Update  The patient was interviewed and re-examined.  The patient's previous History and Physical has been reviewed and is unchanged.  There is no change in the plan of care. We plan to proceed with the scheduled procedure.  Tamelia Michalowski, MD  12/26/2015, 12:28 PM

## 2015-12-26 NOTE — Op Note (Signed)
      [employer] [facility]       Operative Note  Date: 12/26/2015  Preoperative diagnosis:  1. Pancreatic cancer  Postoperative diagnosis:  Same as above  Procedures: #1. Ultrasound guidance for vascular access to the right internal jugular vein. #2. Fluoroscopic guidance for placement of catheter. #3. Placement of CT compatible Port-A-Cath, right internal jugular vein.  Surgeon: Leotis Pain, MD.   Anesthesia: Local with moderate conscious sedation for approximately 20  minutes using 5 mg of Versed and 100 mcg of Fentanyl  Fluoroscopy time: less than 1 minute  Contrast used: 0  Estimated blood loss: Minimal  Indication for the procedure:  The patient is a 70 y.o.male with cancer of the pancreas.  The patient needs a Port-A-Cath for durable venous access, chemotherapy, lab draws, and CT scans. We are asked to place this. Risks and benefits were discussed and informed consent was obtained.  Description of procedure: The patient was brought to the vascular and interventional radiology suite.  Moderate conscious sedation was administered throughout the procedure with my supervision of the RN administering medicines and monitoring the patient's vital signs, pulse oximetry, telemetry and mental status throughout from the start of the procedure until the patient was taken to the recovery room. The right neck chest and shoulder were sterilely prepped and draped, and a sterile surgical field was created. Ultrasound was used to help visualize a patent right internal jugular vein. This was then accessed under direct ultrasound guidance without difficulty with the Seldinger needle and a permanent image was recorded. A J-wire was placed. After skin nick and dilatation, the peel-away sheath was then placed over the wire. I then anesthetized an area under the clavicle approximately 1-2 fingerbreadths. A transverse incision was created and an inferior pocket was created with electrocautery and  blunt dissection. The port was then brought onto the field, placed into the pocket and secured to the chest wall with 2 Prolene sutures. The catheter was connected to the port and tunneled from the subclavicular incision to the access site. Fluoroscopic guidance was then used to cut the catheter to an appropriate length. The catheter was then placed through the peel-away sheath and the peel-away sheath was removed. The catheter tip was parked in excellent location under fluorocoscopic guidance in the cavoatrial junction area. The pocket was then irrigated with antibiotic impregnated saline and the wound was closed with a running 3-0 Vicryl and a 4-0 Monocryl. The access incision was closed with a single 4-0 Monocryl. The Huber needle was used to withdraw blood and flush the port with heparinized saline. Dermabond was then placed as a dressing. The patient tolerated the procedure well and was taken to the recovery room in stable condition.   DEW,JASON 12/26/2015 3:10 PM

## 2015-12-27 ENCOUNTER — Encounter: Payer: Self-pay | Admitting: Internal Medicine

## 2015-12-27 ENCOUNTER — Inpatient Hospital Stay (HOSPITAL_BASED_OUTPATIENT_CLINIC_OR_DEPARTMENT_OTHER): Payer: Medicare HMO | Admitting: Internal Medicine

## 2015-12-27 ENCOUNTER — Inpatient Hospital Stay: Payer: Medicare HMO

## 2015-12-27 VITALS — BP 136/74 | HR 97 | Temp 97.5°F | Resp 18

## 2015-12-27 VITALS — BP 145/66 | HR 114 | Temp 97.0°F | Wt 135.3 lb

## 2015-12-27 DIAGNOSIS — C252 Malignant neoplasm of tail of pancreas: Secondary | ICD-10-CM | POA: Diagnosis not present

## 2015-12-27 DIAGNOSIS — C787 Secondary malignant neoplasm of liver and intrahepatic bile duct: Secondary | ICD-10-CM

## 2015-12-27 DIAGNOSIS — Z5111 Encounter for antineoplastic chemotherapy: Secondary | ICD-10-CM | POA: Diagnosis not present

## 2015-12-27 DIAGNOSIS — R109 Unspecified abdominal pain: Secondary | ICD-10-CM

## 2015-12-27 DIAGNOSIS — D509 Iron deficiency anemia, unspecified: Secondary | ICD-10-CM | POA: Diagnosis not present

## 2015-12-27 DIAGNOSIS — Z79899 Other long term (current) drug therapy: Secondary | ICD-10-CM

## 2015-12-27 MED ORDER — SODIUM CHLORIDE 0.9% FLUSH
10.0000 mL | INTRAVENOUS | Status: DC | PRN
  Administered 2015-12-27: 10 mL
  Filled 2015-12-27: qty 10

## 2015-12-27 MED ORDER — SODIUM CHLORIDE 0.9 % IV SOLN
1600.0000 mg | Freq: Once | INTRAVENOUS | Status: AC
  Administered 2015-12-27: 1600 mg via INTRAVENOUS
  Filled 2015-12-27: qty 42

## 2015-12-27 MED ORDER — SODIUM CHLORIDE 0.9 % IV SOLN
Freq: Once | INTRAVENOUS | Status: AC
  Administered 2015-12-27: 10:00:00 via INTRAVENOUS
  Filled 2015-12-27: qty 1000

## 2015-12-27 MED ORDER — SODIUM CHLORIDE 0.9 % IV SOLN
Freq: Once | INTRAVENOUS | Status: AC
  Administered 2015-12-27: 10:00:00 via INTRAVENOUS
  Filled 2015-12-27: qty 4

## 2015-12-27 MED ORDER — SODIUM CHLORIDE 0.9 % IV SOLN
510.0000 mg | Freq: Once | INTRAVENOUS | Status: AC
  Administered 2015-12-27: 510 mg via INTRAVENOUS
  Filled 2015-12-27: qty 17

## 2015-12-27 MED ORDER — HEPARIN SOD (PORK) LOCK FLUSH 100 UNIT/ML IV SOLN
500.0000 [IU] | Freq: Once | INTRAVENOUS | Status: AC | PRN
  Administered 2015-12-27: 500 [IU]
  Filled 2015-12-27: qty 5

## 2015-12-27 MED ORDER — PROCHLORPERAZINE MALEATE 10 MG PO TABS
10.0000 mg | ORAL_TABLET | Freq: Once | ORAL | Status: AC
  Administered 2015-12-27: 10 mg via ORAL
  Filled 2015-12-27: qty 1

## 2015-12-27 MED ORDER — PACLITAXEL PROTEIN-BOUND CHEMO INJECTION 100 MG
125.0000 mg/m2 | Freq: Once | INTRAVENOUS | Status: AC
  Administered 2015-12-27: 200 mg via INTRAVENOUS
  Filled 2015-12-27: qty 40

## 2015-12-27 NOTE — Progress Notes (Signed)
[place] NOTE  Patient Care Team: Perrin Maltese, MD as PCP - General (Internal Medicine) Clent Jacks, RN as Registered Nurse  Dr. Olivia Mackie Mclean-Scocuzza MD  CHIEF COMPLAINTS/PURPOSE OF CONSULTATION: Pancreatic cancer   ONCOLOGIC HISTORY:  # FEB 2017- PANCREATIC ADENO CA [s/p EUS-Liver bx];EUS- multiple liver lesions [left lobe 34x87mm]; Pancreatic tail mass [49x54mm];Jan2017-  CT C/A/P- 6cm pan tail mass; multiple liver lesions; Lungs- neg;   # Iron def Anemia- ? Etiology- start Iv iron.   # Smoking/diabetes  HISTORY OF PRESENTING ILLNESS:  Nathaniel Graham. 70 y.o.  male   Newly diagnosed metastatic pancreatic cancer to the liver is here to start cycle #1 of chemotherapy today.   In the interim patient underwent  Port placement.  He is mildly sore from the port.  He continues to complain of abdominal pain-  He is on factor at 25 g;  And also  Percocet every 8 hours or so.   He continues to lose weight. Poor appetite.  Intermittent nausea no vomiting.   No blood in stools black colored stools.  No  Unusualcough no unusual shortness of breath or chest pain.  Patient has chronic cough;  Not getting any worse.- he is a smoker.  He denies any tingling and numbness of his extremities.  ROS: A complete 10 point review of system is done which is negative except mentioned above in history of present illness  MEDICAL HISTORY:  Past Medical History  Diagnosis Date  . Hypertension   . Diabetes mellitus without complication ([place])   . Iron deficiency anemia   . Pancreatic cancer ([place]) 12/15/2015  . Blood dyscrasia     SURGICAL HISTORY: Past Surgical History  Procedure Laterality Date  . Upper esophageal endoscopic ultrasound (eus) N/A 12/15/2015    Procedure: UPPER ESOPHAGEAL ENDOSCOPIC ULTRASOUND (EUS);  Surgeon: Cora Daniels, MD;  Location: [facility] ENDOSCOPY;  Service: Endoscopy;  Laterality: N/A;  . Cataract surgery Bilateral     SOCIAL  HISTORY: denies any alcohol. Lives with his significant other at home in [place]; used to work as Dealer.  Social History   Social History  . Marital Status: Single    Spouse Name: N/A  . Number of Children: N/A  . Years of Education: N/A   Occupational History  . Not on file.   Social History Main Topics  . Smoking status: Current Every Day Smoker -- 30 years    Types: Cigarettes  . Smokeless tobacco: Never Used     Comment: smokes 3-4 cigarettes a day "STARTED SMOKING LATE TEENS"  . Alcohol Use: No  . Drug Use: No  . Sexual Activity:    Partners: Female   Other Topics Concern  . Not on file   Social History Narrative    FAMILY HISTORY: Family History  Problem Relation Age of Onset  . Diabetes Mellitus II Mother   . Osteoarthritis Father     ALLERGIES:  has No Known Allergies.  MEDICATIONS:  Current Outpatient Prescriptions  Medication Sig Dispense Refill  . aspirin 81 MG tablet Take 81 mg by mouth daily.    . fentaNYL (DURAGESIC - DOSED MCG/HR) 25 MCG/HR patch Place 1 patch (25 mcg total) onto the skin every 3 (three) days. 5 patch 0  . ferrous fumarate (HEMOCYTE - 106 MG FE) 325 (106 Fe) MG TABS tablet Take 1 tablet by mouth.    Marland Kitchen glimepiride (AMARYL) 1 MG tablet Take 1 tablet by mouth 2 (two) times  daily.    . lidocaine-prilocaine (EMLA) cream Apply 1 application topically as needed. 30 g 6  . lisinopril (PRINIVIL,ZESTRIL) 10 MG tablet Take 10 mg by mouth daily.    . metFORMIN (GLUCOPHAGE) 1000 MG tablet Take 1,000 mg by mouth 2 (two) times daily with a meal.    . oxyCODONE (OXYCONTIN) 30 MG 12 hr tablet Take by mouth.    . oxyCODONE-acetaminophen (PERCOCET) 7.5-325 MG tablet Take 1 tablet by mouth every 8 (eight) hours as needed for severe pain. 90 tablet 0  . pantoprazole (PROTONIX) 40 MG tablet Take 40 mg by mouth daily.    . polyethylene glycol powder (GLYCOLAX/MIRALAX) powder Take 17 g by mouth as needed. FOR CONSTIPATION    . simvastatin (ZOCOR) 10 MG  tablet Take 10 mg by mouth daily.     No current facility-administered medications for this visit.      Marland Kitchen  PHYSICAL EXAMINATION: ECOG PERFORMANCE STATUS: 2 - Symptomatic, <50% confined to bed  Filed Vitals:   12/27/15 0923  BP: 145/66  Pulse: 114  Temp: 97 F (36.1 C)   Filed Weights   12/27/15 0923  Weight: 135 lb 4 oz (61.35 kg)    GENERAL:  Thin built moderately nourished African-American male patient.  Mild distress because of pain.  Accompanied by family. EYES: no pallor or icterus OROPHARYNX: no thrush or ulceration; good dentition  NECK: supple, no masses felt LYMPH:  no palpable lymphadenopathy in the cervical, axillary or inguinal regions LUNGS: clear to auscultation and  No wheeze or crackles HEART/CVS: regular rate & rhythm and no murmurs; No lower extremity edema ABDOMEN: abdomen soft, non-tender and normal bowel sounds Musculoskeletal:no cyanosis of digits and no clubbing  PSYCH: alert & oriented x 3 with fluent speech NEURO: no focal motor/sensory deficits SKIN:  no rashes or significant lesions  LABORATORY DATA:  I have reviewed the data as listed Lab Results  Component Value Date   WBC 11.2* 12/20/2015   HGB 8.1* 12/20/2015   HCT 26.2* 12/20/2015   MCV 70.4* 12/20/2015   PLT 319 12/20/2015    Recent Labs  12/20/15 1028  NA 133*  K 4.3  CL 98*  CO2 26  GLUCOSE 224*  BUN 14  CREATININE 0.74  CALCIUM 9.3  GFRNONAA >60  GFRAA >60  PROT 7.4  ALBUMIN 3.4*  AST 30  ALT 30  ALKPHOS 367*  BILITOT 0.6    RADIOGRAPHIC STUDIES: I have personally reviewed the radiological images as listed and agreed with the findings in the report. Ct Outside Films Chest  12/26/2015  This examination belongs to an outside facility and is stored here for comparison purposes only.  Contact the originating outside institution for any associated report or interpretation.   ASSESSMENT & PLAN:   # 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   Start gemcitabine and Abraxane cycle #1 today.   Again reviewed The potential side effects.  #  Pain control-  Recommend continued use of fentanyl 25 g [ patient started this approximately 4 days ago].  Continue Percocet every 8 hours.  #  Iron deficiency anemia-  Questionable bleeding.  No active bleeding noted. Hold off any GI workup in the context of his metastatic plan today cancer. Recommend IV iron infusion.   #  Patient will see me back in  2 weeks/ labs/ cycle #2.  # 25 minutes face-to-face with the patient discussing the above plan of care; more than 50% of time spent on prognosis/ natural  history; counseling and coordination.     Cammie Sickle, MD 12/27/2015 9:27 AM

## 2015-12-30 NOTE — Progress Notes (Unsigned)
PSN left patient a voice mail message today, asking that he call back to schedule a time to meet to discuss his financial concerns.

## 2016-01-02 ENCOUNTER — Encounter: Payer: Self-pay | Admitting: Family Medicine

## 2016-01-02 ENCOUNTER — Telehealth: Payer: Self-pay | Admitting: Internal Medicine

## 2016-01-02 ENCOUNTER — Other Ambulatory Visit: Payer: Self-pay | Admitting: Family Medicine

## 2016-01-02 DIAGNOSIS — R066 Hiccough: Secondary | ICD-10-CM

## 2016-01-02 HISTORY — DX: Hiccough: R06.6

## 2016-01-02 MED ORDER — CHLORPROMAZINE HCL 25 MG PO TABS: 25.0000 mg | ORAL_TABLET | Freq: Four times a day (QID) | ORAL | Status: DC | PRN

## 2016-01-02 MED ORDER — CHLORPROMAZINE HCL 25 MG PO TABS
25.0000 mg | ORAL_TABLET | Freq: Four times a day (QID) | ORAL | Status: DC | PRN
  Filled 2016-01-02: qty 1

## 2016-01-02 NOTE — Telephone Encounter (Signed)
Per Magda Paganini, NP "I called the med in to the [facility] under the [place] because he couldn't pay for it. Mickel Baas called him to go pick up and I called and left a message for Barnabas Lister."

## 2016-01-02 NOTE — Telephone Encounter (Signed)
Patient came in complaining of hiccups that have lasted more than five days. He is unable to sleep at night due to the constant hiccups. Nathaniel Graham prescribed thorazine and advised the patient to pick up at pharmacy. Patient came back an hour later complaining that prescription costs over $100 and he can't afford it. He is hoping you will have another solution. Please advise and call patient at [phone].

## 2016-01-03 ENCOUNTER — Encounter: Payer: Self-pay | Admitting: *Deleted

## 2016-01-03 NOTE — Progress Notes (Signed)
The patient and his caregiver [name] came by the [facility] cancer center for face to face discussion on symptom management for his hiccouphs. He reports that he took his first dose of the thorazine today at 1pm and "has not noticed any difference in relief for his hiccoughs"  Dr. Rogue Bussing and I discussed with patient that he needs to take the thorazine tablet every 6 hours until his symptoms improve. He will not notice an immediate relief just by taking 1 pill.  The patient states that the frequent episodes of "hicoughs causes his port a cath site to hurt really bad" I explained to the patient and [name] that he is using his chest muscles when he hicoughs so this could be the potential cause of the discomfort at the port a cath site. In conversation, it was noted that the patient is only taking 1 percocet for pain at night time. He reports interrupted sleep process. "I'm just so tired. I can't rest. I've very agitated because I'm not sleeping and I'm saying hurtful words to [facility] because I'm in pain"  RN encouraged the patient to take his narcotic on a scheduled time frame to see if this improves his pain. He denies any nausea, vomiting or diarrhea. He has a daily bowel movements without straining. I advised the patient to continue to drink plenty of fluids to avoid constipation. Teach back process performed with patient and his caregiver [place]. Approximately 20 minutes spent with the patient and [facility] educating pt. I encouraged the patient to call Triage for concerns rather than drive to cancer center frequently for face to face with RN. Many questions and concerns can easily be handled over the phone. The patient and caregiver gave verbal understanding.

## 2016-01-10 ENCOUNTER — Encounter: Payer: Self-pay | Admitting: Internal Medicine

## 2016-01-10 ENCOUNTER — Inpatient Hospital Stay: Payer: Medicare HMO

## 2016-01-10 ENCOUNTER — Inpatient Hospital Stay (HOSPITAL_BASED_OUTPATIENT_CLINIC_OR_DEPARTMENT_OTHER): Payer: Medicare HMO | Admitting: Internal Medicine

## 2016-01-10 ENCOUNTER — Other Ambulatory Visit: Payer: Self-pay | Admitting: Internal Medicine

## 2016-01-10 VITALS — BP 135/79 | HR 106 | Temp 97.0°F | Resp 18

## 2016-01-10 VITALS — BP 136/66 | HR 112 | Temp 96.4°F | Ht 64.0 in | Wt 138.7 lb

## 2016-01-10 DIAGNOSIS — C787 Secondary malignant neoplasm of liver and intrahepatic bile duct: Secondary | ICD-10-CM | POA: Diagnosis not present

## 2016-01-10 DIAGNOSIS — C259 Malignant neoplasm of pancreas, unspecified: Secondary | ICD-10-CM

## 2016-01-10 DIAGNOSIS — D509 Iron deficiency anemia, unspecified: Secondary | ICD-10-CM

## 2016-01-10 DIAGNOSIS — C252 Malignant neoplasm of tail of pancreas: Secondary | ICD-10-CM

## 2016-01-10 DIAGNOSIS — Z5111 Encounter for antineoplastic chemotherapy: Secondary | ICD-10-CM | POA: Diagnosis not present

## 2016-01-10 DIAGNOSIS — M79601 Pain in right arm: Secondary | ICD-10-CM

## 2016-01-10 DIAGNOSIS — D649 Anemia, unspecified: Secondary | ICD-10-CM

## 2016-01-10 DIAGNOSIS — R05 Cough: Secondary | ICD-10-CM

## 2016-01-10 DIAGNOSIS — Z79899 Other long term (current) drug therapy: Secondary | ICD-10-CM

## 2016-01-10 LAB — COMPREHENSIVE METABOLIC PANEL
ALT: 40 U/L (ref 17–63)
AST: 26 U/L (ref 15–41)
Albumin: 3 g/dL — ABNORMAL LOW (ref 3.5–5.0)
Alkaline Phosphatase: 714 U/L — ABNORMAL HIGH (ref 38–126)
Anion gap: 9 (ref 5–15)
BUN: 16 mg/dL (ref 6–20)
CALCIUM: 8.6 mg/dL — AB (ref 8.9–10.3)
CO2: 26 mmol/L (ref 22–32)
CREATININE: 0.72 mg/dL (ref 0.61–1.24)
Chloride: 97 mmol/L — ABNORMAL LOW (ref 101–111)
GLUCOSE: 270 mg/dL — AB (ref 65–99)
Potassium: 3.9 mmol/L (ref 3.5–5.1)
Sodium: 132 mmol/L — ABNORMAL LOW (ref 135–145)
TOTAL PROTEIN: 6.3 g/dL — AB (ref 6.5–8.1)
Total Bilirubin: 0.8 mg/dL (ref 0.3–1.2)

## 2016-01-10 LAB — CBC WITH DIFFERENTIAL/PLATELET
BASOS ABS: 0.1 10*3/uL (ref 0–0.1)
EOS ABS: 0.3 10*3/uL (ref 0–0.7)
HEMATOCRIT: 21.9 % — AB (ref 40.0–52.0)
Hemoglobin: 6.8 g/dL — ABNORMAL LOW (ref 13.0–18.0)
Lymphocytes Relative: 14 %
Lymphs Abs: 1.2 10*3/uL (ref 1.0–3.6)
MCH: 22.4 pg — ABNORMAL LOW (ref 26.0–34.0)
MCHC: 30.9 g/dL — AB (ref 32.0–36.0)
MCV: 72.4 fL — ABNORMAL LOW (ref 80.0–100.0)
MONO ABS: 0.8 10*3/uL (ref 0.2–1.0)
Neutro Abs: 6.5 10*3/uL (ref 1.4–6.5)
Neutrophils Relative %: 73 %
PLATELETS: 260 10*3/uL (ref 150–440)
RBC: 3.02 MIL/uL — ABNORMAL LOW (ref 4.40–5.90)
RDW: 23.2 % — AB (ref 11.5–14.5)
WBC: 8.9 10*3/uL (ref 3.8–10.6)

## 2016-01-10 LAB — ABO/RH: ABO/RH(D): A POS

## 2016-01-10 LAB — SAMPLE TO BLOOD BANK

## 2016-01-10 LAB — PREPARE RBC (CROSSMATCH)

## 2016-01-10 MED ORDER — SODIUM CHLORIDE 0.9 % IV SOLN
Freq: Once | INTRAVENOUS | Status: AC
  Administered 2016-01-10: 10:00:00 via INTRAVENOUS
  Filled 2016-01-10: qty 4

## 2016-01-10 MED ORDER — HEPARIN SOD (PORK) LOCK FLUSH 100 UNIT/ML IV SOLN: 500.0000 [IU] | Freq: Once | INTRAVENOUS | Status: DC | PRN

## 2016-01-10 MED ORDER — SODIUM CHLORIDE 0.9% FLUSH
10.0000 mL | INTRAVENOUS | Status: DC | PRN
  Filled 2016-01-10: qty 10

## 2016-01-10 MED ORDER — PACLITAXEL PROTEIN-BOUND CHEMO INJECTION 100 MG
125.0000 mg/m2 | Freq: Once | INTRAVENOUS | Status: AC
  Administered 2016-01-10: 200 mg via INTRAVENOUS
  Filled 2016-01-10: qty 40

## 2016-01-10 MED ORDER — SODIUM CHLORIDE 0.9 % IJ SOLN
10.0000 mL | INTRAMUSCULAR | Status: DC | PRN
  Administered 2016-01-10: 10 mL
  Filled 2016-01-10: qty 10

## 2016-01-10 MED ORDER — HEPARIN SOD (PORK) LOCK FLUSH 100 UNIT/ML IV SOLN
500.0000 [IU] | Freq: Once | INTRAVENOUS | Status: AC | PRN
  Administered 2016-01-10: 500 [IU]

## 2016-01-10 MED ORDER — ALTEPLASE 2 MG IJ SOLR
2.0000 mg | Freq: Once | INTRAMUSCULAR | Status: DC | PRN
  Filled 2016-01-10: qty 2

## 2016-01-10 MED ORDER — OXYCODONE-ACETAMINOPHEN 7.5-325 MG PO TABS: 1.0000 | ORAL_TABLET | Freq: Three times a day (TID) | ORAL | Status: DC | PRN

## 2016-01-10 MED ORDER — FENTANYL 25 MCG/HR TD PT72: 25.0000 ug | MEDICATED_PATCH | TRANSDERMAL | Status: DC

## 2016-01-10 MED ORDER — SODIUM CHLORIDE 0.9 % IV SOLN
Freq: Once | INTRAVENOUS | Status: DC
  Filled 2016-01-10: qty 1000

## 2016-01-10 MED ORDER — SODIUM CHLORIDE 0.9 % IJ SOLN
3.0000 mL | Freq: Once | INTRAMUSCULAR | Status: DC | PRN
  Filled 2016-01-10: qty 10

## 2016-01-10 MED ORDER — SODIUM CHLORIDE 0.9 % IV SOLN
1600.0000 mg | Freq: Once | INTRAVENOUS | Status: AC
  Administered 2016-01-10: 1600 mg via INTRAVENOUS
  Filled 2016-01-10: qty 42

## 2016-01-10 MED ORDER — PROCHLORPERAZINE MALEATE 10 MG PO TABS
10.0000 mg | ORAL_TABLET | Freq: Once | ORAL | Status: AC
  Administered 2016-01-10: 10 mg via ORAL
  Filled 2016-01-10: qty 1

## 2016-01-10 MED ORDER — SODIUM CHLORIDE 0.9 % IV SOLN
Freq: Once | INTRAVENOUS | Status: AC
  Administered 2016-01-10: 10:00:00 via INTRAVENOUS
  Filled 2016-01-10: qty 1000

## 2016-01-10 MED ORDER — FERUMOXYTOL INJECTION 510 MG/17 ML
510.0000 mg | Freq: Once | INTRAVENOUS | Status: AC
  Administered 2016-01-10: 510 mg via INTRAVENOUS
  Filled 2016-01-10: qty 17

## 2016-01-10 MED ORDER — HEPARIN SOD (PORK) LOCK FLUSH 100 UNIT/ML IV SOLN
250.0000 [IU] | Freq: Once | INTRAVENOUS | Status: DC | PRN
  Filled 2016-01-10: qty 5

## 2016-01-10 NOTE — Addendum Note (Signed)
Addended by: Luella Cook on: 01/10/2016 05:39 PM   Modules accepted: Orders

## 2016-01-10 NOTE — Progress Notes (Signed)
Patient denies pain. Reports swelling in ankles and right arm.

## 2016-01-10 NOTE — Progress Notes (Signed)
[place] NOTE  Patient Care Team: Perrin Maltese, MD as PCP - General (Internal Medicine) Clent Jacks, RN as Registered Nurse  Dr. Olivia Mackie Mclean-Scocuzza MD  CHIEF COMPLAINTS/PURPOSE OF CONSULTATION: Pancreatic cancer   ONCOLOGIC HISTORY:  # FEB 2017- PANCREATIC ADENO CA [s/p EUS-Liver bx];EUS- multiple liver lesions [left lobe 34x18mm]; Pancreatic tail mass [49x69mm];Jan2017-  CT C/A/P- 6cm pan tail mass; multiple liver lesions; Lungs- neg;   # Iron def Anemia- ? Etiology- start Iv iron.   # Smoking/diabetes  HISTORY OF PRESENTING ILLNESS:  Nathaniel Albert. 70 y.o.  male with above  history of metastatic pancreatic cancer on palliative chemotherapy - gemcitabine and Abraxane status post 1 cycle is here for follow-up.  Patient complains of pain in his right arm for a scale of 10 which radiates to his hand. He denies any significant swelling. He does complain of mild ankle swelling bilaterally. Patient's abdominal pain is better. He continues to be on fentanyl 25 g; Percocet every 8 hours.   His hiccups are improved since being on Thorazine. Denies any tingling and numbness of the extremities.    He continues to lose weight. Poor appetite.  Intermittent nausea no vomiting. No  Unusualcough no unusual shortness of breath or chest pain.  Patient has chronic cough;  Not getting any worse.- he is a smoker.  No diarrhea but constipation.  ROS: A complete 10 point review of system is done which is negative except mentioned above in history of present illness  MEDICAL HISTORY:  Past Medical History  Diagnosis Date  . Hypertension   . Diabetes mellitus without complication ([place])   . Iron deficiency anemia   . Pancreatic cancer ([place]) 12/15/2015  . Blood dyscrasia   . Intractable hiccups 01/02/2016    SURGICAL HISTORY: Past Surgical History  Procedure Laterality Date  . Upper esophageal endoscopic ultrasound (eus) N/A 12/15/2015    Procedure: UPPER  ESOPHAGEAL ENDOSCOPIC ULTRASOUND (EUS);  Surgeon: Cora Daniels, MD;  Location: [facility] ENDOSCOPY;  Service: Endoscopy;  Laterality: N/A;  . Cataract surgery Bilateral   . Peripheral vascular catheterization N/A 12/26/2015    Procedure: Porta Cath Insertion;  Surgeon: Algernon Huxley, MD;  Location: [place] CV LAB;  Service: Cardiovascular;  Laterality: N/A;    SOCIAL HISTORY: denies any alcohol. Lives with his significant other at home in [place]; used to work as Dealer.  Social History   Social History  . Marital Status: Single    Spouse Name: N/A  . Number of Children: N/A  . Years of Education: N/A   Occupational History  . Not on file.   Social History Main Topics  . Smoking status: Current Every Day Smoker -- 0.25 packs/day for 30 years    Types: Cigarettes  . Smokeless tobacco: Never Used     Comment: smokes 3-4 cigarettes a day "STARTED SMOKING LATE TEENS"  . Alcohol Use: No  . Drug Use: No  . Sexual Activity:    Partners: Female   Other Topics Concern  . Not on file   Social History Narrative    FAMILY HISTORY: Family History  Problem Relation Age of Onset  . Diabetes Mellitus II Mother   . Osteoarthritis Father     ALLERGIES:  has No Known Allergies.  MEDICATIONS:  Current Outpatient Prescriptions  Medication Sig Dispense Refill  . aspirin 81 MG tablet Take 81 mg by mouth daily.    . chlorproMAZINE (THORAZINE) 25 MG tablet Take 1 tablet (  25 mg total) by mouth 4 (four) times daily as needed. 30 tablet 1  . fentaNYL (DURAGESIC - DOSED MCG/HR) 25 MCG/HR patch Place 1 patch (25 mcg total) onto the skin every 3 (three) days. 5 patch 0  . ferrous fumarate (HEMOCYTE - 106 MG FE) 325 (106 Fe) MG TABS tablet Take 1 tablet by mouth.    Marland Kitchen glimepiride (AMARYL) 1 MG tablet Take 1 tablet by mouth 2 (two) times daily.    Marland Kitchen lidocaine-prilocaine (EMLA) cream Apply 1 application topically as needed. 30 g 6  . lisinopril (PRINIVIL,ZESTRIL) 10 MG tablet Take 10 mg  by mouth daily.    . metFORMIN (GLUCOPHAGE) 1000 MG tablet Take 1,000 mg by mouth 2 (two) times daily with a meal.    . oxyCODONE (OXYCONTIN) 30 MG 12 hr tablet Take by mouth.    . oxyCODONE-acetaminophen (PERCOCET) 7.5-325 MG tablet Take 1 tablet by mouth every 8 (eight) hours as needed for severe pain. 90 tablet 0  . pantoprazole (PROTONIX) 40 MG tablet Take 40 mg by mouth daily.    . polyethylene glycol powder (GLYCOLAX/MIRALAX) powder Take 17 g by mouth as needed. FOR CONSTIPATION    . simvastatin (ZOCOR) 10 MG tablet Take 10 mg by mouth daily.     No current facility-administered medications for this visit.      Marland Kitchen  PHYSICAL EXAMINATION: ECOG PERFORMANCE STATUS: 2 - Symptomatic, <50% confined to bed  Filed Vitals:   01/10/16 0856  BP: 136/66  Pulse: 112  Temp: 96.4 F (35.8 C)   Filed Weights   01/10/16 0919  Weight: 138 lb 10.7 oz (62.9 kg)    GENERAL:  Thin built moderately nourished African-American male patient.  Not in any distress.  Accompanied by family. He is in wheel chair. EYES: no pallor or icterus OROPHARYNX: no thrush or ulceration; poor dentition  NECK: supple, no masses felt LYMPH:  no palpable lymphadenopathy in the cervical, axillary or inguinal regions LUNGS: clear to auscultation and  No wheeze or crackles HEART/CVS: regular rate & rhythm and no murmurs; bil ankle swelling ABDOMEN: abdomen soft, non-tender and normal bowel sounds Musculoskeletal:no cyanosis of digits and no clubbing  PSYCH: alert & oriented x 3 with fluent speech NEURO: no focal motor/sensory deficits SKIN:  no rashes or significant lesions  LABORATORY DATA:  I have reviewed the data as listed Lab Results  Component Value Date   WBC 11.2* 12/20/2015   HGB 8.1* 12/20/2015   HCT 26.2* 12/20/2015   MCV 70.4* 12/20/2015   PLT 319 12/20/2015    Recent Labs  12/20/15 1028 01/10/16 0835  NA 133* 132*  K 4.3 3.9  CL 98* 97*  CO2 26 26  GLUCOSE 224* 270*  BUN 14 16   CREATININE 0.74 0.72  CALCIUM 9.3 8.6*  GFRNONAA >60 >60  GFRAA >60 >60  PROT 7.4 6.3*  ALBUMIN 3.4* 3.0*  AST 30 26  ALT 30 40  ALKPHOS 367* 714*  BILITOT 0.6 0.8    RADIOGRAPHIC STUDIES: I have personally reviewed the radiological images as listed and agreed with the findings in the report. Ct Outside Films Chest  12/26/2015  This examination belongs to an outside facility and is stored here for comparison purposes only.  Contact the originating outside institution for any associated report or interpretation.   ASSESSMENT & PLAN:   # 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 On palliative chemotherapy with gemcitabine and Abraxane. Status post cycle 1. Tolerate chemotherapy fairly well except  for Hicupps  [see discussion below]  # Proceed with cycle #2 today. CMP within normal limits except for elevated alkaline phosphatase with normal LFTs bilirubin. CBC pending.   #  Pain control-  Recommend continued use of fentanyl 25 g;  Continue Percocet every 8 hours.Under control. Continue current pain medication new prescription given.  # Hicupps- question related to chemotherapy status post Thorazine; resolved.   # Right arm pain- recommend [place] of right UE.   # Anemia- Hb 6.8. Will plan IV iron; will plan 2 unit of PRBC tomorrow.  Discussed the potential complications.  #  Patient will see me back in  2 weeks/ labs/ cycle #3.   # 25 minutes face-to-face with the patient discussing the above plan of care; more than 50% of time spent on prognosis/ natural history; counseling and coordination.     Cammie Sickle, MD 01/10/2016 9:23 AM

## 2016-01-10 NOTE — Progress Notes (Signed)
Pt here for chemotherapy infusion. Hemoglobin is 6.8 today. Dr Rogue Bussing gave instructions to proceed with patients planned chemo; abraxane and gemzar. He would like patient to receive feraheme at the end of treatment today. Pt is being Typed and screened for 2 units of blood (PRBCs)  tomm in [facility]. Patient tolerated medications today. His gait is steady with frequent trips to the bathroom during infusions. He denies dizziness. No dyspnea noted. Patient will also plan on having Doppler U/S of his arm tomm at [facility] after his blood transfusions. Port was left accessed for Blood tomm.

## 2016-01-11 ENCOUNTER — Other Ambulatory Visit: Payer: Self-pay | Admitting: *Deleted

## 2016-01-11 ENCOUNTER — Inpatient Hospital Stay: Payer: Medicare HMO | Attending: Internal Medicine

## 2016-01-11 ENCOUNTER — Ambulatory Visit
Admission: RE | Admit: 2016-01-11 | Discharge: 2016-01-11 | Disposition: A | Discharge status: Home / Self Care | Payer: Medicare HMO | Source: Ambulatory Visit | Attending: Internal Medicine | Admitting: Internal Medicine

## 2016-01-11 VITALS — BP 119/84 | HR 105 | Temp 97.5°F | Resp 18

## 2016-01-11 DIAGNOSIS — Z79899 Other long term (current) drug therapy: Secondary | ICD-10-CM | POA: Insufficient documentation

## 2016-01-11 DIAGNOSIS — R066 Hiccough: Secondary | ICD-10-CM | POA: Insufficient documentation

## 2016-01-11 DIAGNOSIS — M79601 Pain in right arm: Secondary | ICD-10-CM | POA: Diagnosis present

## 2016-01-11 DIAGNOSIS — D649 Anemia, unspecified: Secondary | ICD-10-CM | POA: Insufficient documentation

## 2016-01-11 DIAGNOSIS — E119 Type 2 diabetes mellitus without complications: Secondary | ICD-10-CM | POA: Insufficient documentation

## 2016-01-11 DIAGNOSIS — I82C11 Acute embolism and thrombosis of right internal jugular vein: Secondary | ICD-10-CM | POA: Insufficient documentation

## 2016-01-11 DIAGNOSIS — R531 Weakness: Secondary | ICD-10-CM | POA: Insufficient documentation

## 2016-01-11 DIAGNOSIS — C252 Malignant neoplasm of tail of pancreas: Secondary | ICD-10-CM | POA: Insufficient documentation

## 2016-01-11 DIAGNOSIS — Z86718 Personal history of other venous thrombosis and embolism: Secondary | ICD-10-CM | POA: Insufficient documentation

## 2016-01-11 DIAGNOSIS — I10 Essential (primary) hypertension: Secondary | ICD-10-CM | POA: Insufficient documentation

## 2016-01-11 DIAGNOSIS — C787 Secondary malignant neoplasm of liver and intrahepatic bile duct: Secondary | ICD-10-CM | POA: Insufficient documentation

## 2016-01-11 MED ORDER — DIPHENHYDRAMINE HCL 25 MG PO CAPS
25.0000 mg | ORAL_CAPSULE | Freq: Once | ORAL | Status: AC
  Administered 2016-01-11: 25 mg via ORAL
  Filled 2016-01-11: qty 1

## 2016-01-11 MED ORDER — HEPARIN SOD (PORK) LOCK FLUSH 100 UNIT/ML IV SOLN
500.0000 [IU] | Freq: Once | INTRAVENOUS | Status: AC
  Administered 2016-01-11: 500 [IU] via INTRAVENOUS
  Filled 2016-01-11: qty 5

## 2016-01-11 MED ORDER — ACETAMINOPHEN 325 MG PO TABS
650.0000 mg | ORAL_TABLET | Freq: Once | ORAL | Status: AC
  Administered 2016-01-11: 650 mg via ORAL
  Filled 2016-01-11: qty 2

## 2016-01-11 MED ORDER — SODIUM CHLORIDE 0.9% FLUSH
3.0000 mL | INTRAVENOUS | Status: AC | PRN
  Filled 2016-01-11: qty 3

## 2016-01-12 ENCOUNTER — Other Ambulatory Visit: Payer: Self-pay | Admitting: *Deleted

## 2016-01-12 ENCOUNTER — Telehealth: Payer: Self-pay | Admitting: *Deleted

## 2016-01-12 DIAGNOSIS — I8290 Acute embolism and thrombosis of unspecified vein: Secondary | ICD-10-CM

## 2016-01-12 DIAGNOSIS — C252 Malignant neoplasm of tail of pancreas: Secondary | ICD-10-CM

## 2016-01-12 LAB — TYPE AND SCREEN
ABO/RH(D): A POS
ANTIBODY SCREEN: NEGATIVE
Unit division: 0
Unit division: 0
Unit division: 0

## 2016-01-12 MED ORDER — RIVAROXABAN (XARELTO) VTE STARTER PACK (15 & 20 MG): ORAL_TABLET | ORAL | Status: DC

## 2016-01-12 NOTE — Telephone Encounter (Signed)
rx for xarelto starter pack sent to patient's pharmacy. Pt has blood clot. I explained to this pt and his caregiver [facility] and the need to start on anticoagulant. I explained that he will take 1 tablet (15 mg) twice daily with food for 21 days. He will take 20 mg of Xarelto daily starting on day 22. Teach back process performed with caregiver, Rosa.    Rosa called back and states that [place] does not have the RX called in to pharmacy. I reviewed chart and the escribe did not send RX.  Verbal call to [facility] on [address]-pt's preferred pharmacy to obtain RX. I left a voice mail on pharmacy line for the RX Xarelto starter pack. I called back and waited 20 minutes for pharmacist and no answer. I attempted to let pharmacy know to fill this RX stat!  I called Rosa back to let her know that this RX was verbally called into the pharmacy. Should the patient need copay assistance, she will contact our office back.

## 2016-01-13 ENCOUNTER — Other Ambulatory Visit: Payer: Self-pay | Admitting: *Deleted

## 2016-01-13 ENCOUNTER — Telehealth: Payer: Self-pay | Admitting: Internal Medicine

## 2016-01-13 DIAGNOSIS — R066 Hiccough: Secondary | ICD-10-CM

## 2016-01-13 DIAGNOSIS — C252 Malignant neoplasm of tail of pancreas: Secondary | ICD-10-CM

## 2016-01-13 MED ORDER — CHLORPROMAZINE HCL 25 MG PO TABS: 25.0000 mg | ORAL_TABLET | Freq: Four times a day (QID) | ORAL | Status: DC | PRN

## 2016-01-13 NOTE — Telephone Encounter (Signed)
I spoke with [facility]. Per Caregiver statement, the patient is being very "disgruntle and aggravated lately. He is easily frustrated at the easiest things. He just stays so angry at me. I really think he is just frustrated at his diagnosis period. He doesn't understand his diagnosis and has difficulty remembering things. He believes that he doesn't have a clot in his arm because his arm is not hurting. I told him that he does have a clot, so I'm here to advocate"on his behalf". He states the is is also frustrated that he doesn't have a refill on his thorazine." I explained to [place], that if he needs a RF to thorazine for persistent hiccoughs, then she needs to let us know sooner before the patient runs out of the medication. He also can not afford the copay for the percocet and duragesic patches. [name] is requesting assistance for copay.  I spoke with Dr. Rogue Bussing, he approved a Thorazine RF. I spoke with Elease Etienne, Education officer, museum. He approved the RF copay of the thorazine, fentanyl patches and percocet on the [employer].  Rosa Scales states that she is unable to pick up RX from [facility] until after 3 or 4pm today. She states that [place] will not tell her what the copayment will be until the drugs arrive at the pharmacy. "I'm just so nervous that [place] will not get the medication that he needs for the blood clot." I spoke with Dr. Rogue Bussing and our pharmacist. Our office has the xarelto 15 mg samples (1 week supply) and a copay card. The patient was dispensed Xarelto 15 mg 1 tablet twice daily #14 tablets as verbally ordered under Dr. Rogue Bussing. Rosa was notified to come at 2pm to pick up this sample and copay card.  She thanked me for helping her.

## 2016-01-13 NOTE — Telephone Encounter (Signed)
Patient came to my desk in [place] very agitated that his prescription would not be available until 3pm. They came here thinking we might have the medicine for him here too so that he could take it now. He asked about having his care transferred to [facility] because he feels like his needs are not being met in our clinic. He also expressed anxiety about a blood clot he was told he has even though his arm has not been bothering him. Please call Rosa: [phone].

## 2016-01-13 NOTE — Progress Notes (Signed)
Spoke with [facility] drug. Elease Etienne, approved thorazine, percocet, duragesic patch copay coverage through [employer].

## 2016-01-15 ENCOUNTER — Telehealth: Payer: Self-pay | Admitting: Hematology and Oncology

## 2016-01-15 NOTE — Telephone Encounter (Signed)
Re:  Patient seen in ER at [facility] out of his Percocet and Thorazine, but hadn't picked up his Rx.  Xarelto started 01/13/2016.  Lower extremities swollen.  Concern for CHF.  BNP elevated 1360.  Needs follow-up.  Lequita Asal, MD

## 2016-01-24 ENCOUNTER — Inpatient Hospital Stay: Payer: Medicare HMO

## 2016-01-24 ENCOUNTER — Encounter: Payer: Self-pay | Admitting: Emergency Medicine

## 2016-01-24 ENCOUNTER — Inpatient Hospital Stay
Admission: EM | Admit: 2016-01-24 | Discharge: 2016-01-25 | DRG: 377 | Disposition: A | Discharge status: Home / Self Care | Payer: Medicare HMO | Attending: Internal Medicine | Admitting: Internal Medicine

## 2016-01-24 ENCOUNTER — Encounter: Payer: Self-pay | Admitting: Internal Medicine

## 2016-01-24 ENCOUNTER — Other Ambulatory Visit: Payer: Self-pay

## 2016-01-24 ENCOUNTER — Inpatient Hospital Stay: Payer: Medicare HMO | Admitting: Internal Medicine

## 2016-01-24 VITALS — BP 126/61 | HR 118 | Temp 96.2°F | Resp 18 | Ht 64.0 in | Wt 134.3 lb

## 2016-01-24 DIAGNOSIS — D62 Acute posthemorrhagic anemia: Secondary | ICD-10-CM | POA: Diagnosis present

## 2016-01-24 DIAGNOSIS — I82621 Acute embolism and thrombosis of deep veins of right upper extremity: Secondary | ICD-10-CM | POA: Diagnosis not present

## 2016-01-24 DIAGNOSIS — C787 Secondary malignant neoplasm of liver and intrahepatic bile duct: Secondary | ICD-10-CM | POA: Diagnosis present

## 2016-01-24 DIAGNOSIS — I10 Essential (primary) hypertension: Secondary | ICD-10-CM | POA: Diagnosis present

## 2016-01-24 DIAGNOSIS — C252 Malignant neoplasm of tail of pancreas: Secondary | ICD-10-CM | POA: Diagnosis present

## 2016-01-24 DIAGNOSIS — Z7984 Long term (current) use of oral hypoglycemic drugs: Secondary | ICD-10-CM

## 2016-01-24 DIAGNOSIS — F17210 Nicotine dependence, cigarettes, uncomplicated: Secondary | ICD-10-CM | POA: Diagnosis present

## 2016-01-24 DIAGNOSIS — C259 Malignant neoplasm of pancreas, unspecified: Secondary | ICD-10-CM

## 2016-01-24 DIAGNOSIS — D6481 Anemia due to antineoplastic chemotherapy: Secondary | ICD-10-CM | POA: Diagnosis not present

## 2016-01-24 DIAGNOSIS — K922 Gastrointestinal hemorrhage, unspecified: Secondary | ICD-10-CM | POA: Diagnosis present

## 2016-01-24 DIAGNOSIS — Z7901 Long term (current) use of anticoagulants: Secondary | ICD-10-CM | POA: Diagnosis not present

## 2016-01-24 DIAGNOSIS — K2970 Gastritis, unspecified, without bleeding: Secondary | ICD-10-CM | POA: Diagnosis present

## 2016-01-24 DIAGNOSIS — K921 Melena: Principal | ICD-10-CM | POA: Diagnosis present

## 2016-01-24 DIAGNOSIS — D500 Iron deficiency anemia secondary to blood loss (chronic): Secondary | ICD-10-CM

## 2016-01-24 DIAGNOSIS — Z9221 Personal history of antineoplastic chemotherapy: Secondary | ICD-10-CM

## 2016-01-24 DIAGNOSIS — R066 Hiccough: Secondary | ICD-10-CM | POA: Diagnosis present

## 2016-01-24 DIAGNOSIS — Z86718 Personal history of other venous thrombosis and embolism: Secondary | ICD-10-CM | POA: Diagnosis not present

## 2016-01-24 DIAGNOSIS — E43 Unspecified severe protein-calorie malnutrition: Secondary | ICD-10-CM | POA: Diagnosis present

## 2016-01-24 DIAGNOSIS — M79601 Pain in right arm: Secondary | ICD-10-CM

## 2016-01-24 DIAGNOSIS — Z7982 Long term (current) use of aspirin: Secondary | ICD-10-CM | POA: Diagnosis not present

## 2016-01-24 DIAGNOSIS — E119 Type 2 diabetes mellitus without complications: Secondary | ICD-10-CM | POA: Diagnosis present

## 2016-01-24 DIAGNOSIS — D509 Iron deficiency anemia, unspecified: Secondary | ICD-10-CM

## 2016-01-24 HISTORY — DX: Acute embolism and thrombosis of unspecified vein: I82.90

## 2016-01-24 LAB — CBC
HCT: 17.5 % — ABNORMAL LOW (ref 40.0–52.0)
HEMOGLOBIN: 5.5 g/dL — AB (ref 13.0–18.0)
MCH: 25.9 pg — ABNORMAL LOW (ref 26.0–34.0)
MCHC: 31.3 g/dL — ABNORMAL LOW (ref 32.0–36.0)
MCV: 82.7 fL (ref 80.0–100.0)
PLATELETS: 265 10*3/uL (ref 150–440)
RBC: 2.11 MIL/uL — AB (ref 4.40–5.90)
RDW: 28 % — ABNORMAL HIGH (ref 11.5–14.5)
WBC: 16.7 10*3/uL — AB (ref 3.8–10.6)

## 2016-01-24 LAB — TROPONIN I: TROPONIN I: 0.1 ng/mL — AB (ref <0.031)

## 2016-01-24 LAB — COMPREHENSIVE METABOLIC PANEL
ALBUMIN: 2.7 g/dL — AB (ref 3.5–5.0)
ALBUMIN: 2.7 g/dL — AB (ref 3.5–5.0)
ALK PHOS: 644 U/L — AB (ref 38–126)
ALK PHOS: 665 U/L — AB (ref 38–126)
ALT: 32 U/L (ref 17–63)
ALT: 32 U/L (ref 17–63)
ANION GAP: 4 — AB (ref 5–15)
AST: 25 U/L (ref 15–41)
AST: 27 U/L (ref 15–41)
Anion gap: 4 — ABNORMAL LOW (ref 5–15)
BILIRUBIN TOTAL: 0.6 mg/dL (ref 0.3–1.2)
BUN: 22 mg/dL — AB (ref 6–20)
BUN: 21 mg/dL — ABNORMAL HIGH (ref 6–20)
CALCIUM: 8.2 mg/dL — AB (ref 8.9–10.3)
CALCIUM: 8.6 mg/dL — AB (ref 8.9–10.3)
CO2: 26 mmol/L (ref 22–32)
CO2: 27 mmol/L (ref 22–32)
CREATININE: 0.89 mg/dL (ref 0.61–1.24)
Chloride: 102 mmol/L (ref 101–111)
Chloride: 102 mmol/L (ref 101–111)
Creatinine, Ser: 0.81 mg/dL (ref 0.61–1.24)
GFR calc Af Amer: >60 mL/min (ref >60)
GFR calc Af Amer: >60 mL/min (ref >60)
GFR calc non Af Amer: >60 mL/min (ref >60)
GFR calc non Af Amer: >60 mL/min (ref >60)
GLUCOSE: 343 mg/dL — AB (ref 65–99)
GLUCOSE: 316 mg/dL — AB (ref 65–99)
POTASSIUM: 3.8 mmol/L (ref 3.5–5.1)
Potassium: 3.7 mmol/L (ref 3.5–5.1)
SODIUM: 133 mmol/L — AB (ref 135–145)
Sodium: 132 mmol/L — ABNORMAL LOW (ref 135–145)
TOTAL PROTEIN: 5.8 g/dL — AB (ref 6.5–8.1)
TOTAL PROTEIN: 5.7 g/dL — AB (ref 6.5–8.1)
Total Bilirubin: 0.5 mg/dL (ref 0.3–1.2)

## 2016-01-24 LAB — CBC WITH DIFFERENTIAL/PLATELET
BASOS PCT: 1 %
Basophils Absolute: 0.1 10*3/uL (ref 0–0.1)
Eosinophils Absolute: 0.6 10*3/uL (ref 0–0.7)
Eosinophils Relative: 4 %
HEMATOCRIT: 18.2 % — AB (ref 40.0–52.0)
HEMOGLOBIN: 5.5 g/dL — AB (ref 13.0–18.0)
LYMPHS PCT: 5 %
Lymphs Abs: 0.8 10*3/uL — ABNORMAL LOW (ref 1.0–3.6)
MCH: 25.6 pg — ABNORMAL LOW (ref 26.0–34.0)
MCHC: 30.3 g/dL — AB (ref 32.0–36.0)
MCV: 84.7 fL (ref 80.0–100.0)
MONO ABS: 1.1 10*3/uL — AB (ref 0.2–1.0)
MONOS PCT: 7 %
NEUTROS ABS: 13.8 10*3/uL — AB (ref 1.4–6.5)
NEUTROS PCT: 83 %
Platelets: 267 10*3/uL (ref 150–440)
RBC: 2.15 MIL/uL — ABNORMAL LOW (ref 4.40–5.90)
RDW: 28.1 % — AB (ref 11.5–14.5)
WBC: 16.4 10*3/uL — ABNORMAL HIGH (ref 3.8–10.6)

## 2016-01-24 LAB — APTT: APTT: 26 s (ref 24–36)

## 2016-01-24 LAB — GLUCOSE, CAPILLARY
Glucose-Capillary: 408 mg/dL — ABNORMAL HIGH (ref 65–99)
Glucose-Capillary: 427 mg/dL — ABNORMAL HIGH (ref 65–99)
Glucose-Capillary: 215 mg/dL — ABNORMAL HIGH (ref 65–99)

## 2016-01-24 LAB — PREPARE RBC (CROSSMATCH)

## 2016-01-24 LAB — PROTIME-INR
INR: 1.23
PROTHROMBIN TIME: 15.7 s — AB (ref 11.4–15.0)

## 2016-01-24 MED ORDER — METFORMIN HCL 500 MG PO TABS
1000.0000 mg | ORAL_TABLET | Freq: Two times a day (BID) | ORAL | Status: DC
  Administered 2016-01-24 – 2016-01-25 (×2): 1000 mg via ORAL
  Filled 2016-01-24 (×2): qty 2

## 2016-01-24 MED ORDER — NICOTINE POLACRILEX 2 MG MT GUM
2.0000 mg | CHEWING_GUM | OROMUCOSAL | Status: DC | PRN
  Administered 2016-01-24: 17:00:00 2 mg via ORAL
  Filled 2016-01-24 (×2): qty 1

## 2016-01-24 MED ORDER — PANTOPRAZOLE SODIUM 40 MG IV SOLR
40.0000 mg | Freq: Two times a day (BID) | INTRAVENOUS | Status: DC
  Administered 2016-01-24 – 2016-01-25 (×3): 40 mg via INTRAVENOUS
  Filled 2016-01-24 (×3): qty 40

## 2016-01-24 MED ORDER — LISINOPRIL 20 MG PO TABS
20.0000 mg | ORAL_TABLET | Freq: Every day | ORAL | Status: DC
  Administered 2016-01-24 – 2016-01-25 (×2): 20 mg via ORAL
  Filled 2016-01-24 (×2): qty 1

## 2016-01-24 MED ORDER — HEPARIN SOD (PORK) LOCK FLUSH 100 UNIT/ML IV SOLN: 500.0000 [IU] | Freq: Once | INTRAVENOUS | Status: AC

## 2016-01-24 MED ORDER — SODIUM CHLORIDE 0.9 % IV SOLN
10.0000 mL/h | Freq: Once | INTRAVENOUS | Status: AC
  Administered 2016-01-24: 10 mL/h via INTRAVENOUS

## 2016-01-24 MED ORDER — SODIUM CHLORIDE 0.9% FLUSH
10.0000 mL | INTRAVENOUS | Status: AC | PRN
  Administered 2016-01-24: 10 mL via INTRAVENOUS
  Filled 2016-01-24: qty 10

## 2016-01-24 MED ORDER — FENTANYL 25 MCG/HR TD PT72
25.0000 ug | MEDICATED_PATCH | TRANSDERMAL | Status: DC
  Administered 2016-01-24: 25 ug via TRANSDERMAL
  Filled 2016-01-24: qty 1

## 2016-01-24 MED ORDER — NICOTINE 21 MG/24HR TD PT24
21.0000 mg | MEDICATED_PATCH | Freq: Every day | TRANSDERMAL | Status: DC
  Administered 2016-01-24 – 2016-01-25 (×2): 21 mg via TRANSDERMAL
  Filled 2016-01-24 (×2): qty 1

## 2016-01-24 MED ORDER — NICOTINE 10 MG IN INHA: 1.0000 | RESPIRATORY_TRACT | Status: DC | PRN

## 2016-01-24 MED ORDER — SIMVASTATIN 20 MG PO TABS
10.0000 mg | ORAL_TABLET | Freq: Every day | ORAL | Status: DC
  Administered 2016-01-24: 17:00:00 10 mg via ORAL
  Administered 2016-01-25: 09:00:00 via ORAL
  Filled 2016-01-24 (×2): qty 1

## 2016-01-24 MED ORDER — INSULIN ASPART 100 UNIT/ML ~~LOC~~ SOLN
12.0000 [IU] | Freq: Once | SUBCUTANEOUS | Status: AC
  Administered 2016-01-24: 18:00:00 12 [IU] via SUBCUTANEOUS
  Filled 2016-01-24: qty 12

## 2016-01-24 MED ORDER — CHLORPROMAZINE HCL 25 MG PO TABS: 25.0000 mg | ORAL_TABLET | Freq: Four times a day (QID) | ORAL | Status: DC | PRN

## 2016-01-24 MED ORDER — OXYCODONE-ACETAMINOPHEN 7.5-325 MG PO TABS: 1.0000 | ORAL_TABLET | Freq: Three times a day (TID) | ORAL | Status: DC | PRN

## 2016-01-24 MED ORDER — INSULIN ASPART 100 UNIT/ML ~~LOC~~ SOLN
0.0000 [IU] | Freq: Three times a day (TID) | SUBCUTANEOUS | Status: DC
  Administered 2016-01-25: 09:00:00 2 [IU] via SUBCUTANEOUS
  Filled 2016-01-24: qty 2

## 2016-01-24 NOTE — Progress Notes (Signed)
Per md order-Patient being transferred by EMS for symptomatic anemia. HGB 5.5. H/o pancreatic cancer. Recently started on Xarelto for blood clot surrounding the port a cath in Right IJ/subclavian junction. Vital currently stable. Pt c/o shortness of breath upon exertion. MD request urgent transport to ED for blood transfusion and ED work up to r/o bleed. Pt agreeable to transport. Port a cath access was saline locked by Hoy Register, RN.  911 Call initiated. Handoff provided to EMT and Daryel November in ED. EMS provided with pt demographics, insurance card, SBAR report including vitals and medication reconciliation list.

## 2016-01-24 NOTE — Progress Notes (Signed)
Pt here for treatment. He states he is drinking and eating good. His bowels are ok. He states he has no nausea.  His caregiver states that he picks and chooses what meds he will take and some days he takes them all and some days he just takes a few.  It varies each day as to which meds he will take and not take.

## 2016-01-24 NOTE — ED Provider Notes (Signed)
[facility] Emergency Department Provider Note  ____________________________________________    I have reviewed the triage vital signs and the nursing notes.   HISTORY  Chief Complaint Abnormal Lab    HPI [name] is a 70 y.o. male who presents with complaints of abnormal labs. Patient was sent from oncologist for low hemoglobin. He is on Xarelto. He reports normal stools. He complains of feeling weak over the last 3 weeks. He denies dizziness. No vomiting. He is being treated for pancreatic cancer last received chemotherapy approximately one half weeks ago. He denies fevers or chills.     Past Medical History  Diagnosis Date  . Hypertension   . Diabetes mellitus without complication ([place])   . Iron deficiency anemia   . Pancreatic cancer ([place]) 12/15/2015  . Blood dyscrasia   . Intractable hiccups 01/02/2016  . Liver cancer [facility])     Patient Active Problem List   Diagnosis Date Noted  . Intractable hiccups 01/02/2016  . Pancreatic cancer ([place]) 12/21/2015  . Iron deficiency anemia 12/21/2015    Past Surgical History  Procedure Laterality Date  . Upper esophageal endoscopic ultrasound (eus) N/A 12/15/2015    Procedure: UPPER ESOPHAGEAL ENDOSCOPIC ULTRASOUND (EUS);  Surgeon: Cora Daniels, MD;  Location: [facility] ENDOSCOPY;  Service: Endoscopy;  Laterality: N/A;  . Cataract surgery Bilateral   . Peripheral vascular catheterization N/A 12/26/2015    Procedure: Porta Cath Insertion;  Surgeon: Algernon Huxley, MD;  Location: [facility] CV LAB;  Service: Cardiovascular;  Laterality: N/A;    Current Outpatient Rx  Name  Route  Sig  Dispense  Refill  . aspirin 81 MG tablet   Oral   Take 81 mg by mouth daily.         . chlorproMAZINE (THORAZINE) 25 MG tablet   Oral   Take 1 tablet (25 mg total) by mouth 4 (four) times daily as needed.   30 tablet   1     Elease Etienne, social worker approved copay to be pl ...   . fentaNYL  (DURAGESIC - DOSED MCG/HR) 25 MCG/HR patch   Transdermal   Place 1 patch (25 mcg total) onto the skin every 3 (three) days.   5 patch   0   . ferrous fumarate (HEMOCYTE - 106 MG FE) 325 (106 Fe) MG TABS tablet   Oral   Take 1 tablet by mouth.         Marland Kitchen glimepiride (AMARYL) 1 MG tablet   Oral   Take 1 tablet by mouth 2 (two) times daily.         Marland Kitchen lidocaine-prilocaine (EMLA) cream   Topical   Apply 1 application topically as needed.   30 g   6   . lisinopril (PRINIVIL,ZESTRIL) 10 MG tablet   Oral   Take 10 mg by mouth daily.         . metFORMIN (GLUCOPHAGE) 1000 MG tablet   Oral   Take 1,000 mg by mouth 2 (two) times daily with a meal.         . oxyCODONE (OXYCONTIN) 30 MG 12 hr tablet   Oral   Take by mouth.         . pantoprazole (PROTONIX) 40 MG tablet   Oral   Take 40 mg by mouth daily.         . polyethylene glycol powder (GLYCOLAX/MIRALAX) powder   Oral   Take 17 g by mouth as needed. FOR CONSTIPATION         .  Rivaroxaban (XARELTO STARTER PACK) 15 & 20 MG TBPK      Take as directed on package: Start with one 15mg  tablet by mouth twice a day with food. On Day 22, switch to one 20mg  tablet once a day with food.   51 each   0   . simvastatin (ZOCOR) 10 MG tablet   Oral   Take 10 mg by mouth daily.           Allergies Review of patient's allergies indicates no known allergies.  Family History  Problem Relation Age of Onset  . Diabetes Mellitus II Mother   . Osteoarthritis Father     Social History Social History  Substance Use Topics  . Smoking status: Current Every Day Smoker -- 0.25 packs/day for 30 years    Types: Cigarettes  . Smokeless tobacco: Never Used     Comment: smokes 3-4 cigarettes a day "STARTED SMOKING LATE TEENS"  . Alcohol Use: No    Review of Systems  Constitutional: Negative for fever.Positive for weakness Eyes: Negative for redness ENT: Negative for sore throat Cardiovascular: Negative for chest  pain Respiratory: Negative for shortness of breath. No cough Gastrointestinal: Negative for abdominal pain. Normal stools Genitourinary: Negative for dysuria. Musculoskeletal: Negative for back pain. Skin: Negative for rash. Neurological: Negative for focal weakness Psychiatric: no anxiety    ____________________________________________   PHYSICAL EXAM:  VITAL SIGNS: ED Triage Vitals  Enc Vitals Group     BP 01/24/16 1030 137/68 mmHg     Pulse Rate 01/24/16 1030 115     Resp 01/24/16 1030 20     Temp 01/24/16 1032 98.2 F (36.8 C)     Temp Source 01/24/16 1032 Oral     SpO2 01/24/16 1030 98 %     Weight 01/24/16 1032 141 lb 14.4 oz (64.365 kg)     Height 01/24/16 1032 5\' 4"  (1.626 m)     Head Cir --      Peak Flow --      Pain Score 01/24/16 1033 0     Pain Loc --      Pain Edu? --      Excl. in [place]? --      Constitutional: Alert and oriented. No acute distress Eyes: Conjunctivae are normal. No erythema or injection ENT   Head: Normocephalic and atraumatic.   Mouth/Throat: Mucous membranes are moist. Cardiovascular: Tachycardia, regular rhythm. Normal and symmetric distal pulses are present in the upper extremities. Port noted, no tenderness or redness Respiratory: Normal respiratory effort without tachypnea nor retractions. Breath sounds are clear and equal bilaterally.  Gastrointestinal: Soft and non-tender in all quadrants. No distention. There is no CVA tenderness. Black stool guaiac positive Genitourinary: deferred Musculoskeletal: Nontender with normal range of motion in all extremities.  Neurologic:  Normal speech and language. No gross focal neurologic deficits are appreciated. Skin:  Skin is warm, dry and intact. No rash noted. Psychiatric: Mood and affect are normal. Patient exhibits appropriate insight and judgment.  ____________________________________________    LABS (pertinent positives/negatives)  Labs Reviewed  CBC - Abnormal; Notable for  the following:    WBC 16.7 (*)    RBC 2.11 (*)    Hemoglobin 5.5 (*)    HCT 17.5 (*)    MCH 25.9 (*)    MCHC 31.3 (*)    RDW 28.0 (*)    All other components within normal limits  PROTIME-INR - Abnormal; Notable for the following:    Prothrombin Time 15.7 (*)  All other components within normal limits  APTT  COMPREHENSIVE METABOLIC PANEL  TROPONIN I  TYPE AND SCREEN  PREPARE RBC (CROSSMATCH)    ____________________________________________   EKG  ED ECG REPORT I, Lavonia Drafts, the attending physician, personally viewed and interpreted this ECG.  Date: 01/24/2016 EKG Time: 10:40 AM Rate: 111 Rhythm: Sinus tachycardia QRS Axis: normal Intervals: normal ST/T Wave abnormalities: normal Conduction Disturbances: none Narrative Interpretation: unremarkable   ____________________________________________    RADIOLOGY  None  ____________________________________________   PROCEDURES  Procedure(s) performed: none  Critical Care performed: yes  CRITICAL CARE Performed by: Lavonia Drafts   Total critical care time: 30 minutes  Critical care time was exclusive of separately billable procedures and treating other patients.  Critical care was necessary to treat or prevent imminent or life-threatening deterioration.  Critical care was time spent personally by me on the following activities: development of treatment plan with patient and/or surrogate as well as nursing, discussions with consultants, evaluation of patient's response to treatment, examination of patient, obtaining history from patient or surrogate, ordering and performing treatments and interventions, ordering and review of laboratory studies, ordering and review of radiographic studies, pulse oximetry and re-evaluation of patient's condition.   ____________________________________________   INITIAL IMPRESSION / ASSESSMENT AND PLAN / ED COURSE  Pertinent labs & imaging results that were available  during my care of the patient were reviewed by me and considered in my medical decision making (see chart for details).  Patient with hemoglobin of 5.5. Black stool, guaiac positive and he is on Xarelto. Daughter reports it is not clear if he is completely compliant with his medications. We will give PRBCs. I'll not reverse Xarelto at this time as it seems to be a somewhat slow and chronic GI bleed, blood pressure is normal  I obtained blood consent from the patient  2 units PRBCs ordered, I will admit to the hospital  ____________________________________________   FINAL CLINICAL IMPRESSION(S) / ED DIAGNOSES  Final diagnoses:  Gastrointestinal hemorrhage, unspecified gastritis, unspecified gastrointestinal hemorrhage type          Lavonia Drafts, MD 01/24/16 1127

## 2016-01-24 NOTE — Plan of Care (Signed)
Problem: Safety: Goal: Ability to remain free from injury will improve Outcome: Progressing Uses cane to ambulate. With assist.   Problem: Bowel/Gastric: Goal: Will show no signs and symptoms of gastrointestinal bleeding Outcome: Not Progressing No stool since arrival pt reports stool this am. Receiving  Blood. On 2nd unit. Pt to have egd  Tomorrow.

## 2016-01-24 NOTE — ED Notes (Signed)
Pt has Porta-cath to R chest, accessed by cancer center.

## 2016-01-24 NOTE — ED Notes (Signed)
Troponin reported to MD

## 2016-01-24 NOTE — Consult Note (Signed)
GI Inpatient Consult Note  Reason for Consult:Acute severe anemia   Attending Requesting Consult:Hospitalist  History of Present Illness: Nathaniel Licursi. is a 70 y.o. male who was seen in clinic and found to have severe acute anemia with hgb 5.5, hx of black stool, does take pepto bismol now and then, had 2 units of blood a month ago,  Went to cancer center for treatment of pancreatic cancer but due to anemia was admitted to hospital.  Patient with known pancreatic cancer with liver mass.   Past Medical History:  Past Medical History  Diagnosis Date  . Hypertension   . Diabetes mellitus without complication ([place])   . Iron deficiency anemia   . Pancreatic cancer ([place]) 12/15/2015  . Blood dyscrasia   . Intractable hiccups 01/02/2016  . Liver cancer ([place])   . Thrombus     in right IJ after medport placement in Feb 2017    Problem List: Patient Active Problem List   Diagnosis Date Noted  . GI bleed 01/24/2016  . Blood loss anemia 01/24/2016  . Intractable hiccups 01/02/2016  . Pancreatic cancer ([place]) 12/21/2015  . Iron deficiency anemia 12/21/2015    Past Surgical History: Past Surgical History  Procedure Laterality Date  . Upper esophageal endoscopic ultrasound (eus) N/A 12/15/2015    Procedure: UPPER ESOPHAGEAL ENDOSCOPIC ULTRASOUND (EUS);  Surgeon: Cora Daniels, MD;  Location: [facility] ENDOSCOPY;  Service: Endoscopy;  Laterality: N/A;  . Cataract surgery Bilateral   . Peripheral vascular catheterization N/A 12/26/2015    Procedure: Porta Cath Insertion;  Surgeon: Algernon Huxley, MD;  Location: [facility] CV LAB;  Service: Cardiovascular;  Laterality: N/A;    Allergies: No Known Allergies  Home Medications: Prescriptions prior to admission  Medication Sig Dispense Refill Last Dose  . chlorproMAZINE (THORAZINE) 25 MG tablet Take 1 tablet (25 mg total) by mouth 4 (four) times daily as needed. 30 tablet 1 PRN  . fentaNYL (DURAGESIC - DOSED MCG/HR) 25 MCG/HR patch  Place 1 patch (25 mcg total) onto the skin every 3 (three) days. 5 patch 0 Past Week at Unknown time  . ferrous fumarate (HEMOCYTE - 106 MG FE) 325 (106 Fe) MG TABS tablet Take 1 tablet by mouth daily.    01/23/2016 at Unknown time  . glimepiride (AMARYL) 1 MG tablet Take 1 tablet by mouth 2 (two) times daily.   01/23/2016 at Unknown time  . lidocaine-prilocaine (EMLA) cream Apply 1 application topically as needed. 30 g 6 PRN  . lisinopril (PRINIVIL,ZESTRIL) 20 MG tablet Take 1 tablet by mouth daily.   01/23/2016 at Unknown time  . metFORMIN (GLUCOPHAGE) 1000 MG tablet Take 1,000 mg by mouth 2 (two) times daily with a meal.   01/23/2016 at Unknown time  . oxyCODONE-acetaminophen (PERCOCET) 7.5-325 MG tablet Take 1 tablet by mouth every 8 (eight) hours as needed.  0 PRN  . pantoprazole (PROTONIX) 40 MG tablet Take 40 mg by mouth daily.   01/23/2016 at Unknown time  . polyethylene glycol powder (GLYCOLAX/MIRALAX) powder Take 17 g by mouth as needed. FOR CONSTIPATION   PRN  . Rivaroxaban (XARELTO STARTER PACK) 15 & 20 MG TBPK Take as directed on package: Start with one 15mg  tablet by mouth twice a day with food. On Day 22, switch to one 20mg  tablet once a day with food. 51 each 0 01/23/2016 at Unknown time  . simvastatin (ZOCOR) 10 MG tablet Take 10 mg by mouth daily.   01/23/2016 at Unknown time  Home medication reconciliation was completed with the patient.   Scheduled Inpatient Medications:   . fentaNYL  25 mcg Transdermal Q72H  . insulin aspart  0-9 Units Subcutaneous TID WC  . lisinopril  20 mg Oral Daily  . metFORMIN  1,000 mg Oral BID WC  . nicotine  21 mg Transdermal Daily  . pantoprazole (PROTONIX) IV  40 mg Intravenous Q12H  . simvastatin  10 mg Oral Daily    Continuous Inpatient Infusions:     PRN Inpatient Medications:  chlorproMAZINE, nicotine polacrilex, oxyCODONE-acetaminophen  Family History: family history includes Diabetes Mellitus II in his mother; Osteoarthritis in his  father.  The patient's family history is negative for inflammatory bowel disorders, GI malignancy, or solid organ transplantation.  Social History:   reports that he has been smoking Cigarettes.  He has a 7.5 pack-year smoking history. He has never used smokeless tobacco. He reports that he does not drink alcohol or use illicit drugs. The patient denies ETOH, tobacco, or drug use.   Review of Systems: Constitutional: Weight is stable.  Eyes: No changes in vision. ENT: No oral lesions, sore throat.  GI: see HPI.  Heme/Lymph: No easy bruising.  CV: No chest pain.  GU: No hematuria.  Integumentary: No rashes.  Neuro:  Psych: No depression/anxiety.  Endocrine:  Allergic/Immunologic: No urticaria.  Resp: No cough, SOB.  Musculoskeletal: No joint swelling.    Physical Examination: BP 135/61 mmHg  Pulse 114  Temp(Src) 99.1 F (37.3 C) (Oral)  Resp 20  Ht 5\' 4"  (1.626 m)  Wt 64.365 kg (141 lb 14.4 oz)  BMI 24.34 kg/m2  SpO2 100% Gen: NAD, alert and oriented x 4, tongue pink, palms pink, HEENT: neck no swelling Neck: supple, no JVD or thyromegaly Chest: CTA bilaterally, no wheezes, crackles, or other adventitious sounds, porto on right chest CV: RRR, no m/g/c/r Abd: soft, NT, ND, +BS in all four quadrants; no HSM, guarding, ridigity, or rebound tenderness Ext: no edema, well perfused with 2+ pulses,  Patient has electronic device on left foot for police matters. Skin: no rash or lesions noted Lymph: no LAD  Data: Lab Results  Component Value Date   WBC 16.7* 01/24/2016   HGB 5.5* 01/24/2016   HCT 17.5* 01/24/2016   MCV 82.7 01/24/2016   PLT 265 01/24/2016    Recent Labs Lab 01/24/16 0850 01/24/16 1036  HGB 5.5* 5.5*   Lab Results  Component Value Date   NA 133* 01/24/2016   K 3.8 01/24/2016   CL 102 01/24/2016   CO2 27 01/24/2016   BUN 21* 01/24/2016   CREATININE 0.81 01/24/2016   Lab Results  Component Value Date   ALT 32 01/24/2016   AST 27 01/24/2016    ALKPHOS 665* 01/24/2016   BILITOT 0.5 01/24/2016    Recent Labs Lab 01/24/16 1036  APTT 26  INR 1.23   Assessment/Plan: Nathaniel Graham is a 70 y.o. male with severe anemia with hgb 5.5, known pancreatic cancer and liver mass.  Will transfuse per hospitalist and do EGD tomorrow to find cause of his anemia.  Had EUS  At [facility] 12/15/2015.  Recommendations:  Thank you for the consult. Please call with questions or concerns.  Gaylyn Cheers, MD

## 2016-01-24 NOTE — H&P (Addendum)
Nathaniel Graham at [facility] NAME: Nathaniel Graham    MR#:  [id number]  DATE OF BIRTH:  [birth year]  DATE OF ADMISSION:  01/24/2016  PRIMARY CARE PHYSICIAN: Perrin Maltese, MD   REQUESTING/REFERRING PHYSICIAN: [place]  CHIEF COMPLAINT:   Chief Complaint  Patient presents with  . Abnormal Lab    hemoglobin 5.5    HISTORY OF PRESENT ILLNESS: Nathaniel Graham  is a 70 y.o. male with a known history of hypertension, diabetes, iron deficiency anemia, pancreatic cancer on chemotherapy, on chemotherapy, recently found nonocclusive thrombus in the right IJ vein after- placing Mediport. He is on Xarelto since then. Today he went to have his chemotherapy, but when they check his hemoglobin A 2 was 5.5 so they sent him to emergency room for getting blood transfusion. In ER he is noted to have black colored stool which was positive to guaiac.  PAST MEDICAL HISTORY:   Past Medical History  Diagnosis Date  . Hypertension   . Diabetes mellitus without complication ([place])   . Iron deficiency anemia   . Pancreatic cancer ([place]) 12/15/2015  . Blood dyscrasia   . Intractable hiccups 01/02/2016  . Liver cancer ([place])   . Thrombus     in right IJ after medport placement in Feb 2017    PAST SURGICAL HISTORY: Past Surgical History  Procedure Laterality Date  . Upper esophageal endoscopic ultrasound (eus) N/A 12/15/2015    Procedure: UPPER ESOPHAGEAL ENDOSCOPIC ULTRASOUND (EUS);  Surgeon: Cora Daniels, MD;  Location: [facility] ENDOSCOPY;  Service: Endoscopy;  Laterality: N/A;  . Cataract surgery Bilateral   . Peripheral vascular catheterization N/A 12/26/2015    Procedure: Porta Cath Insertion;  Surgeon: Algernon Huxley, MD;  Location: [facility];  Service: Cardiovascular;  Laterality: N/A;    SOCIAL HISTORY:  Social History  Substance Use Topics  . Smoking status: Current Every Day Smoker -- 0.25 packs/day for 30 years    Types: Cigarettes  .  Smokeless tobacco: Never Used     Comment: smokes 3-4 cigarettes a day "STARTED SMOKING LATE TEENS"  . Alcohol Use: No    FAMILY HISTORY:  Family History  Problem Relation Age of Onset  . Diabetes Mellitus II Mother   . Osteoarthritis Father     DRUG ALLERGIES: No Known Allergies  REVIEW OF SYSTEMS:   CONSTITUTIONAL: No fever, positive for fatigue or weakness.  EYES: No blurred or double vision.  EARS, NOSE, AND THROAT: No tinnitus or ear pain.  RESPIRATORY: No cough, shortness of breath, wheezing or hemoptysis.  CARDIOVASCULAR: No chest pain, orthopnea, edema.  GASTROINTESTINAL: No nausea, vomiting, diarrhea or abdominal pain. Dark stools. GENITOURINARY: No dysuria, hematuria.  ENDOCRINE: No polyuria, nocturia,  HEMATOLOGY: No anemia, easy bruising or bleeding SKIN: No rash or lesion. MUSCULOSKELETAL: No joint pain or arthritis.   NEUROLOGIC: No tingling, numbness, weakness.  PSYCHIATRY: No anxiety or depression.   MEDICATIONS AT HOME:  Prior to Admission medications   Medication Sig Start Date End Date Taking? Authorizing Provider  aspirin 81 MG tablet Take 81 mg by mouth daily.    Historical Provider, MD  chlorproMAZINE (THORAZINE) 25 MG tablet Take 1 tablet (25 mg total) by mouth 4 (four) times daily as needed. 01/13/16   Cammie Sickle, MD  fentaNYL (DURAGESIC - DOSED MCG/HR) 25 MCG/HR patch Place 1 patch (25 mcg total) onto the skin every 3 (three) days. 01/10/16   Cammie Sickle, MD  ferrous fumarate (HEMOCYTE -  106 MG FE) 325 (106 Fe) MG TABS tablet Take 1 tablet by mouth.    Historical Provider, MD  glimepiride (AMARYL) 1 MG tablet Take 1 tablet by mouth 2 (two) times daily.    Historical Provider, MD  lidocaine-prilocaine (EMLA) cream Apply 1 application topically as needed. 12/21/15   Cammie Sickle, MD  lisinopril (PRINIVIL,ZESTRIL) 10 MG tablet Take 10 mg by mouth daily.    Historical Provider, MD  metFORMIN (GLUCOPHAGE) 1000 MG tablet Take 1,000 mg  by mouth 2 (two) times daily with a meal.    Historical Provider, MD  oxyCODONE (OXYCONTIN) 30 MG 12 hr tablet Take by mouth.    Historical Provider, MD  pantoprazole (PROTONIX) 40 MG tablet Take 40 mg by mouth daily.    Historical Provider, MD  polyethylene glycol powder (GLYCOLAX/MIRALAX) powder Take 17 g by mouth as needed. FOR CONSTIPATION 12/01/15   Historical Provider, MD  Rivaroxaban (XARELTO STARTER PACK) 15 & 20 MG TBPK Take as directed on package: Start with one 15mg  tablet by mouth twice a day with food. On Day 22, switch to one 20mg  tablet once a day with food. 01/12/16   Cammie Sickle, MD  simvastatin (ZOCOR) 10 MG tablet Take 10 mg by mouth daily.    Historical Provider, MD      PHYSICAL EXAMINATION:   VITAL SIGNS: Blood pressure 149/72, pulse 110, temperature 98.2 F (36.8 C), temperature source Oral, resp. rate 15, height 5\' 4"  (1.626 m), weight 64.365 kg (141 lb 14.4 oz), SpO2 98 %.  GENERAL:  70 y.o.-year-old patient lying in the bed with no acute distress.  EYES: Pupils equal, round, reactive to light and accommodation. No scleral icterus. Extraocular muscles intact.  HEENT: Head atraumatic, normocephalic. Oropharynx and nasopharynx clear.  NECK:  Supple, no jugular venous distention. No thyroid enlargement, no tenderness.  LUNGS: Normal breath sounds bilaterally, no wheezing, rales,rhonchi or crepitation. No use of accessory muscles of respiration.  CARDIOVASCULAR: S1, S2 normal. No murmurs, rubs, or gallops.  ABDOMEN: Soft, nontender, nondistended. Bowel sounds present. No organomegaly or mass.  EXTREMITIES: No pedal edema, cyanosis, or clubbing.  NEUROLOGIC: Cranial nerves II through XII are intact. Muscle strength 5/5 in all extremities. Sensation intact. Gait not checked.  PSYCHIATRIC: The patient is alert and oriented x 3.  SKIN: No obvious rash, lesion, or ulcer.   LABORATORY PANEL:   CBC  Recent Labs Lab 01/24/16 0850 01/24/16 1036  WBC 16.4* 16.7*   HGB 5.5* 5.5*  HCT 18.2* 17.5*  PLT 267 265  MCV 84.7 82.7  MCH 25.6* 25.9*  MCHC 30.3* 31.3*  RDW 28.1* 28.0*  LYMPHSABS 0.8*  --   MONOABS 1.1*  --   EOSABS 0.6  --   BASOSABS 0.1  --    ------------------------------------------------------------------------------------------------------------------  Chemistries   Recent Labs Lab 01/24/16 0850 01/24/16 1036  NA 132* 133*  K 3.7 3.8  CL 102 102  CO2 26 27  GLUCOSE 343* 316*  BUN 22* 21*  CREATININE 0.89 0.81  CALCIUM 8.2* 8.6*  AST 25 27  ALT 32 32  ALKPHOS 644* 665*  BILITOT 0.6 0.5   ------------------------------------------------------------------------------------------------------------------ estimated creatinine clearance is 73.1 mL/min (by C-G formula based on Cr of 0.81). ------------------------------------------------------------------------------------------------------------------ No results for input(s): TSH, T4TOTAL, T3FREE, THYROIDAB in the last 72 hours.  Invalid input(s): FREET3   Coagulation profile  Recent Labs Lab 01/24/16 1036  INR 1.23   ------------------------------------------------------------------------------------------------------------------- No results for input(s): DDIMER in the last 72 hours. -------------------------------------------------------------------------------------------------------------------  Cardiac  Enzymes  Recent Labs Lab 01/24/16 1036  TROPONINI 0.10*   ------------------------------------------------------------------------------------------------------------------ Invalid input(s): POCBNP  ---------------------------------------------------------------------------------------------------------------  Urinalysis    Component Value Date/Time   COLORURINE Yellow 01/30/2014 1355   APPEARANCEUR Clear 01/30/2014 1355   LABSPEC 1.010 01/30/2014 1355   PHURINE 6.0 01/30/2014 1355   GLUCOSEU 50 mg/dL 01/30/2014 1355   HGBUR Negative 01/30/2014  1355   BILIRUBINUR Negative 01/30/2014 1355   KETONESUR Negative 01/30/2014 1355   PROTEINUR Negative 01/30/2014 1355   NITRITE Negative 01/30/2014 1355   LEUKOCYTESUR Negative 01/30/2014 1355     RADIOLOGY: No results found.  EKG: Orders placed or performed during the hospital encounter of 01/24/16  . ED EKG  . ED EKG    IMPRESSION AND PLAN:  * GI bleed   acute anemia due to blood loss    2 unit blood transfusion ordered by ER.   admit to hospital, give 2 units of blood transfusion.   I'll call GI consult for further management.    Protonix IV twice a day.  * Pancreatic cancer on chemotherapy   Advise him to follow with cancer Center, management per his oncologist.  * recent thrombus in IJ near Mediport    patient is an Xarelto at this point, I will hold it and less GI decide about further plan.  * Diabetes- continue metformin.  Keep on insulin sliding scale coverage.  * Hypertension   Continue home medication.  * Smoking  Counseled to quit smoking for 4 minutes and offered nicotine patch.  All the records are reviewed and case discussed with ED provider. Management plans discussed with the patient, family and they are in agreement.  CODE STATUS:full code  Code Status History    This patient does not have a recorded code status. Please follow your organizational policy for patients in this situation.    Advance Directive Documentation        Most Recent Value   Type of Advance Directive  Healthcare Power of Attorney, Living will   Pre-existing out of facility DNR order (yellow form or pink MOST form)     "MOST" Form in Place?         TOTAL TIME TAKING CARE OF THIS PATIENT: 50 minutes.    Vaughan Basta M.D on 01/24/2016   Between 7am to 6pm - Pager - [phone]  After 6pm go to [email or web address] - password [email or web address] [facility]  Office  [phone]  CC: Primary care physician; Perrin Maltese, MD   Note: This dictation  was prepared with Dragon dictation along with smaller phrase technology. Any transcriptional errors that result from this process are unintentional.

## 2016-01-24 NOTE — ED Notes (Signed)
Pt to ED via EMS from [facility]. Went for cancer treatment appointment this morning but was not started d/t low hemoglobin (5.5) per UC staff. Send to ED for blood transfusion and possible scope per EMS. Pt has no other complaints at this time. Was reluctant to come to ED.

## 2016-01-24 NOTE — Progress Notes (Signed)
[facility] NOTE  Patient Care Team: Perrin Maltese, MD as PCP - General (Internal Medicine) Clent Jacks, RN as Registered Nurse  Dr. Olivia Mackie Mclean-Scocuzza MD  CHIEF COMPLAINTS/PURPOSE OF CONSULTATION: Pancreatic cancer   ONCOLOGIC HISTORY:  # FEB 2017- PANCREATIC [place] CA [s/p EUS-Liver bx];EUS- multiple liver lesions [left lobe 34x54mm]; Pancreatic tail mass [49x67mm];Jan2017-  CT C/A/P- 6cm pan tail mass; multiple liver lesions; Lungs- neg;   # Iron def Anemia- ? Etiology- start Iv iron.   # R UE DVT/IJ- xarelto   # Smoking/diabetes  HISTORY OF PRESENTING ILLNESS:  Nathaniel Graham. 70 y.o.  male with above  history of metastatic pancreatic cancer on palliative chemotherapy - gemcitabine and Abraxane status post 2 cycle is here for follow-up.   Interim patient was diagnosed with right upper extremity DVT/ a right IJ-  On Xarelto.  Patient admits to shortness of breath on exertion. Complaints of leg swelling.  Denies any dizziness. Denies any blood in stools or black stools.  Admits to " spitting" blood;  When he coughs.  Patient has chronic cough;  Not getting any worse.- he is a smoker.  No diarrhea but constipation  His hiccups are improved since being on Thorazine. Denies any tingling and numbness of the extremities.   ROS: A complete 10 point review of system is done which is negative except mentioned above in history of present illness  MEDICAL HISTORY:  Past Medical History  Diagnosis Date  . Hypertension   . Diabetes mellitus without complication ([place])   . Iron deficiency anemia   . Pancreatic cancer ([place]) 12/15/2015  . Blood dyscrasia   . Intractable hiccups 01/02/2016    SURGICAL HISTORY: Past Surgical History  Procedure Laterality Date  . Upper esophageal endoscopic ultrasound (eus) N/A 12/15/2015    Procedure: UPPER ESOPHAGEAL ENDOSCOPIC ULTRASOUND (EUS);  Surgeon: Cora Daniels, MD;  Location: [facility] ENDOSCOPY;   Service: Endoscopy;  Laterality: N/A;  . Cataract surgery Bilateral   . Peripheral vascular catheterization N/A 12/26/2015    Procedure: Porta Cath Insertion;  Surgeon: Algernon Huxley, MD;  Location: [facility];  Service: Cardiovascular;  Laterality: N/A;    SOCIAL HISTORY: denies any alcohol. Lives with his significant other at home in [place]; used to work as Dealer.  Social History   Social History  . Marital Status: Single    Spouse Name: N/A  . Number of Children: N/A  . Years of Education: N/A   Occupational History  . Not on file.   Social History Main Topics  . Smoking status: Current Every Day Smoker -- 0.25 packs/day for 30 years    Types: Cigarettes  . Smokeless tobacco: Never Used     Comment: smokes 3-4 cigarettes a day "STARTED SMOKING LATE TEENS"  . Alcohol Use: No  . Drug Use: No  . Sexual Activity:    Partners: Female   Other Topics Concern  . Not on file   Social History Narrative    FAMILY HISTORY: Family History  Problem Relation Age of Onset  . Diabetes Mellitus II Mother   . Osteoarthritis Father     ALLERGIES:  has No Known Allergies.  MEDICATIONS:  Current Outpatient Prescriptions  Medication Sig Dispense Refill  . aspirin 81 MG tablet Take 81 mg by mouth daily.    . chlorproMAZINE (THORAZINE) 25 MG tablet Take 1 tablet (25 mg total) by mouth 4 (four) times daily as needed. 30 tablet 1  .  fentaNYL (DURAGESIC - DOSED MCG/HR) 25 MCG/HR patch Place 1 patch (25 mcg total) onto the skin every 3 (three) days. 5 patch 0  . ferrous fumarate (HEMOCYTE - 106 MG FE) 325 (106 Fe) MG TABS tablet Take 1 tablet by mouth.    Marland Kitchen glimepiride (AMARYL) 1 MG tablet Take 1 tablet by mouth 2 (two) times daily.    Marland Kitchen lidocaine-prilocaine (EMLA) cream Apply 1 application topically as needed. 30 g 6  . lisinopril (PRINIVIL,ZESTRIL) 10 MG tablet Take 10 mg by mouth daily.    . metFORMIN (GLUCOPHAGE) 1000 MG tablet Take 1,000 mg by mouth 2 (two) times daily  with a meal.    . oxyCODONE (OXYCONTIN) 30 MG 12 hr tablet Take by mouth.    . pantoprazole (PROTONIX) 40 MG tablet Take 40 mg by mouth daily.    . polyethylene glycol powder (GLYCOLAX/MIRALAX) powder Take 17 g by mouth as needed. FOR CONSTIPATION    . Rivaroxaban (XARELTO STARTER PACK) 15 & 20 MG TBPK Take as directed on package: Start with one 15mg  tablet by mouth twice a day with food. On Day 22, switch to one 20mg  tablet once a day with food. 51 each 0  . simvastatin (ZOCOR) 10 MG tablet Take 10 mg by mouth daily.     No current facility-administered medications for this visit.   Facility-Administered Medications Ordered in Other Visits  Medication Dose Route Frequency Provider Last Rate Last Dose  . heparin lock flush 100 unit/mL  500 Units Intravenous Once Cammie Sickle, MD      . sodium chloride flush (NS) 0.9 % injection 10 mL  10 mL Intravenous PRN Cammie Sickle, MD   10 mL at 01/24/16 0847  . sodium chloride flush (NS) 0.9 % injection 3 mL  3 mL Intracatheter PRN Cammie Sickle, MD          .  PHYSICAL EXAMINATION: ECOG PERFORMANCE STATUS: 2 - Symptomatic, <50% confined to bed  Filed Vitals:   01/24/16 0919  BP: 126/61  Pulse: 118  Temp: 96.2 F (35.7 C)  Resp: 18   Filed Weights   01/24/16 0919  Weight: 134 lb 4.2 oz (60.9 kg)    GENERAL:  Thin built moderately nourished African-American male patient.  Not in any distress.  Accompanied by family. He is in wheel chair. EYES:positive for palor.  OROPHARYNX: no thrush or ulceration; poor dentition  NECK: supple, no masses felt LYMPH:  no palpable lymphadenopathy in the cervical, axillary or inguinal regions LUNGS: clear to auscultation and  No wheeze or crackles HEART/CVS: regular rate & rhythm and no murmurs; bil ankle swelling ABDOMEN: abdomen soft, non-tender and normal bowel sounds Musculoskeletal:no cyanosis of digits and no clubbing  PSYCH: alert & oriented x 3 with fluent speech NEURO:  no focal motor/sensory deficits SKIN:  no rashes or significant lesions  LABORATORY DATA:  I have reviewed the data as listed Lab Results  Component Value Date   WBC 16.4* 01/24/2016   HGB 5.5* 01/24/2016   HCT 18.2* 01/24/2016   MCV 84.7 01/24/2016   PLT 267 01/24/2016    Recent Labs  12/20/15 1028 01/10/16 0835 01/24/16 0850  NA 133* 132* 132*  K 4.3 3.9 3.7  CL 98* 97* 102  CO2 26 26 26   GLUCOSE 224* 270* 343*  BUN 14 16 22*  CREATININE 0.74 0.72 0.89  CALCIUM 9.3 8.6* 8.2*  GFRNONAA >60 >60 >60  GFRAA >60 >60 >60  PROT 7.4 6.3* 5.8*  ALBUMIN 3.4* 3.0* 2.7*  AST 30 26 25   ALT 30 40 32  ALKPHOS 367* 714* 644*  BILITOT 0.6 0.8 0.6      ASSESSMENT & PLAN:   # 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 On palliative chemotherapy with gemcitabine and Abraxane. Status post cycle 2.    # HOLD cycle #3 today-  Severe anemia; see discussion below  # Severe anemia hemoglobin 5.8-  Iron deficient;  I suspect  Patient has GI bleed;  Although chronic.  However this has to be evaluated further with the EGD/ colonoscopy-  Especially as patient is  On anticoagulation for his DVT.  #  Right upper extremity DVT/ IJ-  Secondary to port.  On Xarelto.  #  Pain control-  Recommend continued use of fentanyl 25 g;  Continue Percocet every 8 hours.Under control. Continue current pain medication.   # Hicupps- question related to chemotherapy status post Thorazine; resolved.   #  Because of the severe anemia/ concern for GI bleed/ on anticoagulation-  I recommend patient go to the emergency room ASAP.  Patient is reluctant;  However  Recommend urgent evaluation in the emergency room.   # 25 minutes face-to-face with the patient discussing the above plan of care; more than 50% of time spent on prognosis/ natural history; counseling and coordination.     Cammie Sickle, MD 01/24/2016 9:35 AM

## 2016-01-24 NOTE — Progress Notes (Signed)
   01/24/16 1614  Clinical Encounter Type  Visited With Patient and family together  Visit Type Initial  Referral From Nurse  Spiritual Encounters  Spiritual Needs Literature  Advance Directives (For Healthcare)  Does patient have an advance directive? Yes  Type of Advance Directive [place]  Does patient want to make changes to advanced directive? Yes - Spiritual care consult ordered  Chaplain Marcello Moores dropped off packet to patient and daughter for review. Will consider updating AD.

## 2016-01-25 ENCOUNTER — Inpatient Hospital Stay: Payer: Medicare HMO | Admitting: Anesthesiology

## 2016-01-25 ENCOUNTER — Encounter: Payer: Self-pay | Admitting: *Deleted

## 2016-01-25 ENCOUNTER — Encounter
Admission: EM | Disposition: A | Discharge status: Home / Self Care | Payer: Self-pay | Source: Home / Self Care | Attending: Internal Medicine

## 2016-01-25 DIAGNOSIS — F17210 Nicotine dependence, cigarettes, uncomplicated: Secondary | ICD-10-CM

## 2016-01-25 DIAGNOSIS — C787 Secondary malignant neoplasm of liver and intrahepatic bile duct: Secondary | ICD-10-CM

## 2016-01-25 DIAGNOSIS — I82621 Acute embolism and thrombosis of deep veins of right upper extremity: Secondary | ICD-10-CM

## 2016-01-25 DIAGNOSIS — E119 Type 2 diabetes mellitus without complications: Secondary | ICD-10-CM

## 2016-01-25 DIAGNOSIS — D6481 Anemia due to antineoplastic chemotherapy: Secondary | ICD-10-CM

## 2016-01-25 DIAGNOSIS — Z7901 Long term (current) use of anticoagulants: Secondary | ICD-10-CM

## 2016-01-25 DIAGNOSIS — C252 Malignant neoplasm of tail of pancreas: Secondary | ICD-10-CM

## 2016-01-25 DIAGNOSIS — I10 Essential (primary) hypertension: Secondary | ICD-10-CM

## 2016-01-25 DIAGNOSIS — E43 Unspecified severe protein-calorie malnutrition: Secondary | ICD-10-CM | POA: Insufficient documentation

## 2016-01-25 HISTORY — PX: ESOPHAGOGASTRODUODENOSCOPY: SHX5428

## 2016-01-25 LAB — TYPE AND SCREEN
ABO/RH(D): A POS
ANTIBODY SCREEN: NEGATIVE
UNIT DIVISION: 0
UNIT DIVISION: 0
Unit division: 0

## 2016-01-25 LAB — BASIC METABOLIC PANEL
ANION GAP: 3 — AB (ref 5–15)
BUN: 14 mg/dL (ref 6–20)
CALCIUM: 8.2 mg/dL — AB (ref 8.9–10.3)
CO2: 26 mmol/L (ref 22–32)
Chloride: 104 mmol/L (ref 101–111)
Creatinine, Ser: 0.61 mg/dL (ref 0.61–1.24)
GFR calc Af Amer: >60 mL/min (ref >60)
GLUCOSE: 134 mg/dL — AB (ref 65–99)
Potassium: 3.8 mmol/L (ref 3.5–5.1)
SODIUM: 133 mmol/L — AB (ref 135–145)

## 2016-01-25 LAB — CBC
HCT: 24.8 % — ABNORMAL LOW (ref 40.0–52.0)
HEMOGLOBIN: 8.1 g/dL — AB (ref 13.0–18.0)
MCH: 27.1 pg (ref 26.0–34.0)
MCHC: 32.7 g/dL (ref 32.0–36.0)
MCV: 82.8 fL (ref 80.0–100.0)
PLATELETS: 230 10*3/uL (ref 150–440)
RBC: 3 MIL/uL — ABNORMAL LOW (ref 4.40–5.90)
RDW: 22.7 % — AB (ref 11.5–14.5)
WBC: 17.9 10*3/uL — AB (ref 3.8–10.6)

## 2016-01-25 LAB — LACTATE DEHYDROGENASE: LDH: 282 U/L — AB (ref 98–192)

## 2016-01-25 LAB — GLUCOSE, CAPILLARY: GLUCOSE-CAPILLARY: 151 mg/dL — AB (ref 65–99)

## 2016-01-25 SURGERY — EGD (ESOPHAGOGASTRODUODENOSCOPY)
Anesthesia: General

## 2016-01-25 MED ORDER — PROPOFOL 10 MG/ML IV BOLUS
INTRAVENOUS | Status: DC | PRN
  Administered 2016-01-25: 10 mg via INTRAVENOUS
  Administered 2016-01-25: 60 mg via INTRAVENOUS
  Administered 2016-01-25 (×2): 20 mg via INTRAVENOUS
  Administered 2016-01-25: 10 mg via INTRAVENOUS

## 2016-01-25 MED ORDER — HEPARIN SOD (PORK) LOCK FLUSH 100 UNIT/ML IV SOLN
500.0000 [IU] | Freq: Once | INTRAVENOUS | Status: AC
  Administered 2016-01-25: 500 [IU] via INTRAVENOUS
  Filled 2016-01-25: qty 5

## 2016-01-25 MED ORDER — SODIUM CHLORIDE 0.9 % IV SOLN
INTRAVENOUS | Status: DC
  Administered 2016-01-25: 1000 mL via INTRAVENOUS

## 2016-01-25 NOTE — Progress Notes (Signed)
Pt being discharged today, ankle bracelet present on pt. Called pd stationed in emergency room to verify it was ok to let him leave, the officer said it was ok to let him go without a police escort. IV Port removed, discharge instructions reviewed with patient and wife. They verified understanding. Pt rolled out in wheelchair by staff.

## 2016-01-25 NOTE — Transfer of Care (Signed)
Immediate Anesthesia Transfer of Care Note  Patient: Nathaniel Graham.  Procedure(s) Performed: Procedure(s) with comments: ESOPHAGOGASTRODUODENOSCOPY (EGD) (N/A) - zvm's cauterized with erbe  Patient Location: PACU and Endoscopy Unit  Anesthesia Type:General  Level of Consciousness: sedated and responds to stimulation  Airway & Oxygen Therapy: Patient Spontanous Breathing and Patient connected to nasal cannula oxygen  Post-op Assessment: Report given to RN and Post -op Vital signs reviewed and stable  Post vital signs: Reviewed and stable  Last Vitals:  Filed Vitals:   01/25/16 1106 01/25/16 1202  BP: 143/69 111/66  Pulse: 109   Temp: 36.7 C 36.6 C  Resp: 16 20    Complications: No apparent anesthesia complications

## 2016-01-25 NOTE — Discharge Summary (Signed)
[place] [facility] [place] NAME: Nathaniel Graham    MR#:  [id number]  DATE OF BIRTH:  September 04, 1946  DATE OF ADMISSION:  01/24/2016 ADMITTING PHYSICIAN: Vaughan Basta, MD  DATE OF DISCHARGE: 01/25/2016  3:20 PM  PRIMARY CARE PHYSICIAN: Perrin Maltese, MD    ADMISSION DIAGNOSIS:  Gastrointestinal hemorrhage, unspecified gastritis, unspecified gastrointestinal hemorrhage type [K92.2]  DISCHARGE DIAGNOSIS:  Principal Problem:   GI bleed Active Problems:   Blood loss anemia   Protein-calorie malnutrition, severe   Malignant neoplasm of tail of pancreas ([place])   SECONDARY DIAGNOSIS:   Past Medical History  Diagnosis Date  . Hypertension   . Diabetes mellitus without complication ([place])   . Iron deficiency anemia   . Blood dyscrasia   . Intractable hiccups 01/02/2016  . Thrombus     in right IJ after medport placement in Feb 2017    HOSPITAL COURSE:   70 year old male with past medical history significant for metastatic pancreatic cancer with metastases to liver, right upper extremity DVT after Port-A-Cath Placement on Xarelto,  hypertension, diabetes mellitus presents to the hospital secondary to anemia.  #1 acute on chronic anemia-hemoglobin is 5.5 and that sent in by his oncologist. -Stool guaiac was positive. Appreciate GI consult. 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 -Hold Xarelto for the next week. Also advised to hold aspirin at this time. -Continue PPI at home. -Started on liquid diet. Patient very anxious to go home. Cleared by GI to go home today. -Patient did receive 2 units of packed RBC transfusion on admission, hemoglobin is at 8 prior to discharge.  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 Patient is on palliative chemotherapy now. Finished 2 cycles. -Follow up with oncology. -Also has chronic abdominal pain  and on fentanyl patch.  #3 diabetes mellitus-continue metformin and Amaryl  #4 intractable hiccups-continue Thorazine.   Patient will be discharged home today.   DISCHARGE CONDITIONS:   Guarded  CONSULTS OBTAINED:  Treatment Team:  Manya Silvas, MD  DRUG ALLERGIES:  No Known Allergies  DISCHARGE MEDICATIONS:   Discharge Medication List as of 01/25/2016  2:53 PM    CONTINUE these medications which have NOT CHANGED   Details  chlorproMAZINE (THORAZINE) 25 MG tablet Take 1 tablet (25 mg total) by mouth 4 (four) times daily as needed., Starting 01/13/2016, Until Discontinued, Normal    fentaNYL (DURAGESIC - DOSED MCG/HR) 25 MCG/HR patch Place 1 patch (25 mcg total) onto the skin every 3 (three) days., Starting 01/10/2016, Until Discontinued, Print    ferrous fumarate (HEMOCYTE - 106 MG FE) 325 (106 Fe) MG TABS tablet Take 1 tablet by mouth daily. , Until Discontinued, Historical Med    glimepiride (AMARYL) 1 MG tablet Take 1 tablet by mouth 2 (two) times daily., Until Discontinued, Historical Med    lidocaine-prilocaine (EMLA) cream Apply 1 application topically as needed., Starting 12/21/2015, Until Discontinued, Normal    lisinopril (PRINIVIL,ZESTRIL) 20 MG tablet Take 1 tablet by mouth daily., Starting 01/09/2016, Until Discontinued, Historical Med    metFORMIN (GLUCOPHAGE) 1000 MG tablet Take 1,000 mg by mouth 2 (two) times daily with a meal., Until Discontinued, Historical Med    oxyCODONE-acetaminophen (PERCOCET) 7.5-325 MG tablet Take 1 tablet by mouth every 8 (eight) hours as needed., Starting 01/16/2016, Until Discontinued, Historical Med    pantoprazole (PROTONIX) 40 MG tablet Take 40 mg by mouth daily., Until Discontinued,  Historical Med    polyethylene glycol powder (GLYCOLAX/MIRALAX) powder Take 17 g by mouth as needed. FOR CONSTIPATION, Starting 12/01/2015, Until Discontinued, Historical Med    simvastatin (ZOCOR) 10 MG tablet Take 10 mg by mouth daily., Until  Discontinued, Historical Med      STOP taking these medications     Rivaroxaban (XARELTO STARTER PACK) 15 & 20 MG TBPK          DISCHARGE INSTRUCTIONS:   1. F/u with Oncology in 2-3 days 2. PCP f/u in 2 weeks  If you experience worsening of your admission symptoms, develop shortness of breath, life threatening emergency, suicidal or homicidal thoughts you must seek medical attention immediately by calling 911 or calling your MD immediately  if symptoms less severe.  You Must read complete instructions/literature along with all the possible adverse reactions/side effects for all the Medicines you take and that have been prescribed to you. Take any new Medicines after you have completely understood and accept all the possible adverse reactions/side effects.   Please note  You were cared for by a hospitalist during your hospital stay. If you have any questions about your discharge medications or the care you received while you were in the hospital after you are discharged, you can call the unit and asked to speak with the hospitalist on call if the hospitalist that took care of you is not available. Once you are discharged, your primary care physician will handle any further medical issues. Please note that NO REFILLS for any discharge medications will be authorized once you are discharged, as it is imperative that you return to your primary care physician (or establish a relationship with a primary care physician if you do not have one) for your aftercare needs so that they can reassess your need for medications and monitor your lab values.    Today   CHIEF COMPLAINT:   Chief Complaint  Patient presents with  . Abnormal Lab    hemoglobin 5.5    VITAL SIGNS:  Blood pressure 148/71, pulse 107, temperature 98.9 F (37.2 C), temperature source Oral, resp. rate 18, height 5\' 4"  (1.626 m), weight 64.365 kg (141 lb 14.4 oz), SpO2 98 %.  I/O:   Intake/Output Summary (Last 24 hours) at  01/25/16 1534 Last data filed at 01/25/16 1202  Gross per 24 hour  Intake   1787 ml  Output    300 ml  Net   1487 ml    PHYSICAL EXAMINATION:   Physical Exam  GENERAL:  70 y.o.-year-old patient lying in the bed with no acute distress. Chronically ill appearing emaciated  EYES: Pupils equal, round, reactive to light and accommodation. No scleral icterus. Extraocular muscles intact.  HEENT: Head atraumatic, normocephalic. Oropharynx and nasopharynx clear.  NECK:  Supple, no jugular venous distention. No thyroid enlargement, no tenderness.  LUNGS: Normal breath sounds bilaterally, no wheezing, rales,rhonchi or crepitation. No use of accessory muscles of respiration.  CARDIOVASCULAR: S1, S2 normal. No murmurs, rubs, or gallops.  ABDOMEN: Soft, generalized discomfort on palpation, non-distended. Bowel sounds present. No organomegaly or mass.  EXTREMITIES: No pedal edema, cyanosis, or clubbing.  NEUROLOGIC: Cranial nerves II through XII are intact. Muscle strength 5/5 in all extremities. Sensation intact. Gait not checked.  PSYCHIATRIC: The patient is alert and oriented x 3.  SKIN: No obvious rash, lesion, or ulcer.   DATA REVIEW:   CBC  Recent Labs Lab 01/25/16 0549  WBC 17.9*  HGB 8.1*  HCT 24.8*  PLT 230  Chemistries   Recent Labs Lab 01/24/16 1036 01/25/16 0549  NA 133* 133*  K 3.8 3.8  CL 102 104  CO2 27 26  GLUCOSE 316* 134*  BUN 21* 14  CREATININE 0.81 0.61  CALCIUM 8.6* 8.2*  AST 27  --   ALT 32  --   ALKPHOS 665*  --   BILITOT 0.5  --     Cardiac Enzymes  Recent Labs Lab 01/24/16 1036  TROPONINI 0.10*    Microbiology Results  No results found for this or any previous visit.  RADIOLOGY:  No results found.  EKG:   Orders placed or performed during the hospital encounter of 01/24/16  . ED EKG  . ED EKG      Management plans discussed with the patient, family and they are in agreement.  CODE STATUS:     Code Status Orders         Start     Ordered   01/24/16 1529  Full code   Continuous     01/24/16 1529    Code Status History    Date Active Date Inactive Code Status Order ID Comments User Context   This patient has a current code status but no historical code status.    Advance Directive Documentation        Most Recent Value   Type of Advance Directive  Healthcare Power of Attorney   Pre-existing out of facility DNR order (yellow form or pink MOST form)     "MOST" Form in Place?        TOTAL TIME TAKING CARE OF THIS PATIENT: 38 minutes.    Gladstone Lighter M.D on 01/25/2016 at 3:34 PM  Between 7am to 6pm - Pager - [phone]  After 6pm go to [email or web address] - password [email or web address] [facility]  Office  [phone]  CC: Primary care physician; Perrin Maltese, MD

## 2016-01-25 NOTE — Op Note (Signed)
[facility] Patient Name: Nathaniel Graham Procedure Date: 01/25/2016 11:44 AM MRN: [id number] Account #: [id number] Date of Birth: [birth year] Admit Type: Inpatient Age: 70 Room: [facility] ENDO ROOM 4 Gender: Male Note Status: Finalized Procedure:            Upper GI endoscopy Indications:          [place] Providers:            Manya Silvas, MD Referring MD:         No Local Md, MD (Referring MD) Medicines:            Propofol per Anesthesia Complications:        No immediate complications. Procedure:            Pre-Anesthesia Assessment:                       - After reviewing the risks and benefits, the patient                        was deemed in satisfactory condition to undergo the                        procedure.                       After obtaining informed consent, the endoscope was                        passed under direct vision. Throughout the procedure,                        the patient's blood pressure, pulse, and oxygen                        saturations were monitored continuously. The Endoscope                        was introduced through the mouth, and advanced to the                        second part of duodenum. The upper GI endoscopy was                        accomplished without difficulty. The patient tolerated                        the procedure well. Findings:      A medium-sized, submucosal mass with no bleeding and no stigmata of       recent bleeding was found in the lower third of the esophagus, 35 cm       from the incisors. The mass was non-obstructing and partially       circumferential (involving one-third of the lumen circumference).      Diffuse mild inflammation characterized by congestion (edema), erythema       and granularity was found in the gastric body.      A single small angioectasia without bleeding was found in the second       portion of the duodenum. Coagulation for tissue destruction using  argon       plasma at 0.4 liters/minute and 20 watts was successful.  Patient exam showed no other sources of UGI bleeding. 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                       - Gastritis.                       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                        Treated with argon plasma coagulation (APC).                       - No specimens collected. Recommendation:       Likely discharge home since his hgb came up to 8 and no                        other findings on the EGD. Manya Silvas, MD 01/25/2016 12:03:05 PM This report has been signed electronically. Number of Addenda: 0 Note Initiated On: 01/25/2016 11:44 AM      [facility]

## 2016-01-25 NOTE — Consult Note (Signed)
[place] NOTE  Patient Care Team: Perrin Maltese, MD as PCP - General (Internal Medicine) Clent Jacks, RN as Registered Nurse  CHIEF COMPLAINTS/PURPOSE OF CONSULTATION: pancreatic cancer & severe anemia  ONCOLOGIC HISTORY:  # FEB 2017- PANCREATIC ADENO CA [s/p EUS-Liver bx];EUS- multiple liver lesions [left lobe 34x53mm]; Pancreatic tail mass [49x2mm];Jan2017- CT C/A/P- 6cm pan tail mass; multiple liver lesions; Lungs- neg;   # Iron def Anemia- ? Etiology- start Iv iron.   # R UE DVT/IJ- xarelto   # Smoking/diabetes  HISTORY OF PRESENTING ILLNESS:   Nathaniel Graham. 70 y.o. male with above history of metastatic pancreatic cancer on palliative chemotherapy - gemcitabine and Abraxane status post 2 cycle was seen the office for # 3 chemo- when his hb was noted to be at 5.6. He was then sent over to hospital for further evaluation/ management.   Recently, patient was diagnosed with right upper extremity DVT/ a right IJ- On Xarelto.  Patient admits to shortness of breath on exertion. Complaints of leg swelling. Denies any dizziness. Denies any blood in stools or black stools. Admits to " spitting" blood; When he coughs. Denies any tingling and numbness of the extremities.   He has been evaluated by GI; plan EGD this AM.   Patient status post 2 units of blood hemoglobin is 8.    ROS: A complete 10 point review of system is done which is negative except mentioned above in history of present illness  MEDICAL HISTORY:  Past Medical History  Diagnosis Date  . Hypertension   . Diabetes mellitus without complication ([place])   . Iron deficiency anemia   . Pancreatic cancer ([place]) 12/15/2015  . Blood dyscrasia   . Intractable hiccups 01/02/2016  . Liver cancer ([place])   . Thrombus     in right IJ after medport placement in Feb 2017    SURGICAL HISTORY: Past Surgical History  Procedure Laterality Date  . Upper esophageal endoscopic ultrasound  (eus) N/A 12/15/2015    Procedure: UPPER ESOPHAGEAL ENDOSCOPIC ULTRASOUND (EUS);  Surgeon: Cora Daniels, MD;  Location[facility] END[facility]COPY;  Service: Endoscopy;  Laterality: N/A;  . Cataract surgery Bilateral   . Peripheral vascular catheterization N/A 12/26/2015    Procedure: Porta Cath Insertion;  Surgeon: Algernon Huxley, MD;  Location: [facility] CV LAB;  Service: Cardiovascular;  Laterality: N/A;    SOCIAL HISTORY: Social History   Social History  . Marital Status: Single    Spouse Name: N/A  . Number of Children: N/A  . Years of Education: N/A   Occupational History  . Not on file.   Social History Main Topics  . Smoking status: Current Every Day Smoker -- 0.25 packs/day for 30 years    Types: Cigarettes  . Smokeless tobacco: Never Used     Comment: smokes 3-4 cigarettes a day "STARTED SMOKING LATE TEENS"  . Alcohol Use: No  . Drug Use: No  . Sexual Activity:    Partners: Female   Other Topics Concern  . Not on file   Social History Narrative    FAMILY HISTORY: Family History  Problem Relation Age of Onset  . Diabetes Mellitus II Mother   . Osteoarthritis Father     ALLERGIES:  has No Known Allergies.  MEDICATIONS:  Current Facility-Administered Medications  Medication Dose Route Frequency Provider Last Rate Last Dose  . chlorproMAZINE (THORAZINE) tablet 25 mg  25 mg Oral QID PRN Vaughan Basta, MD      .  fentaNYL (DURAGESIC - dosed mcg/hr) patch 25 mcg  25 mcg Transdermal Q72H Vaughan Basta, MD   25 mcg at 01/24/16 1639  . insulin aspart (novoLOG) injection 0-9 Units  0-9 Units Subcutaneous TID WC Vaughan Basta, MD   0 Units at 01/24/16 1754  . lisinopril (PRINIVIL,ZESTRIL) tablet 20 mg  20 mg Oral Daily Vaughan Basta, MD   20 mg at 01/24/16 1635  . metFORMIN (GLUCOPHAGE) tablet 1,000 mg  1,000 mg Oral BID WC Vaughan Basta, MD   1,000 mg at 01/24/16 1636  . nicotine (NICODERM CQ - dosed in mg/24 hours) patch 21 mg  21  mg Transdermal Daily Vaughan Basta, MD   21 mg at 01/24/16 1301  . nicotine polacrilex (NICORETTE) gum 2 mg  2 mg Oral PRN Vaughan Basta, MD   2 mg at 01/24/16 1641  . oxyCODONE-acetaminophen (PERCOCET) 7.5-325 MG per tablet 1 tablet  1 tablet Oral Q8H PRN Vaughan Basta, MD      . pantoprazole (PROTONIX) injection 40 mg  40 mg Intravenous Q12H Vaughan Basta, MD   40 mg at 01/24/16 2120  . simvastatin (ZOCOR) tablet 10 mg  10 mg Oral Daily Vaughan Basta, MD   10 mg at 01/24/16 1635   Facility-Administered Medications Ordered in Other Encounters  Medication Dose Route Frequency Provider Last Rate Last Dose  . heparin lock flush 100 unit/mL  500 Units Intravenous Once Cammie Sickle, MD      . sodium chloride flush (NS) 0.9 % injection 10 mL  10 mL Intravenous PRN Cammie Sickle, MD   10 mL at 01/24/16 0847  . sodium chloride flush (NS) 0.9 % injection 3 mL  3 mL Intracatheter PRN Cammie Sickle, MD          .  PHYSICAL EXAMINATION:   Filed Vitals:   01/24/16 2111 01/25/16 0547  BP: 135/70 132/60  Pulse: 112 104  Temp:  97.4 F (36.3 C)  Resp: 20 18   Filed Weights   01/24/16 1032  Weight: 141 lb 14.4 oz (64.365 kg)   GENERAL: Thin built moderately nourished African-American male patient. Not in any distress. Marland Kitchen He is in alone.  EYES:positive for palor.  OROPHARYNX: no thrush or ulceration; poor dentition  NECK: supple, no masses felt LYMPH: no palpable lymphadenopathy in the cervical, axillary or inguinal regions LUNGS: clear to auscultation and No wheeze or crackles HEART/CVS: regular rate & rhythm and no murmurs; bil ankle swelling ABDOMEN: abdomen soft, non-tender and normal bowel sounds Musculoskeletal:no cyanosis of digits and no clubbing  PSYCH: alert & oriented x 3 with fluent speech NEURO: no focal motor/sensory deficits SKIN: no rashes or significant lesions  LABORATORY DATA:  I have reviewed the  data as listed Lab Results  Component Value Date   WBC 17.9* 01/25/2016   HGB 8.1* 01/25/2016   HCT 24.8* 01/25/2016   MCV 82.8 01/25/2016   PLT 230 01/25/2016    Recent Labs  01/10/16 0835 01/24/16 0850 01/24/16 1036 01/25/16 0549  NA 132* 132* 133* 133*  K 3.9 3.7 3.8 3.8  CL 97* 102 102 104  CO2 26 26 27 26   GLUCOSE 270* 343* 316* 134*  BUN 16 22* 21* 14  CREATININE 0.72 0.89 0.81 0.61  CALCIUM 8.6* 8.2* 8.6* 8.2*  GFRNONAA >60 >60 >60 >60  GFRAA >60 >60 >60 >60  PROT 6.3* 5.8* 5.7*  --   ALBUMIN 3.0* 2.7* 2.7*  --   AST 26 25 27   --  ALT 40 32 32  --   ALKPHOS 714* 644* 665*  --   BILITOT 0.8 0.6 0.5  --     RADIOGRAPHIC STUDIES: I have personally reviewed the radiological images as listed and agreed with the findings in the report. US Venous Img Upper Uni Right  01/11/2016  ADDENDUM REPORT: 01/11/2016 19:01 ADDENDUM: These results were discussed by the ultrasound technologist Marco Collie with Dr. Charlaine Dalton , on 01/11/2016 at 7:00 pm who verbally acknowledged these results. Electronically Signed   By: Anner Crete M.D.   On: 01/11/2016 19:01  01/11/2016  CLINICAL DATA:  71 year old male with right arm pain. History of metastatic pancreatic cancer. EXAM: Right UPPER EXTREMITY VENOUS DOPPLER ULTRASOUND TECHNIQUE: Gray-scale sonography with graded compression, as well as color Doppler and duplex ultrasound were performed to evaluate the upper extremity deep venous system from the level of the subclavian vein and including the jugular, axillary, basilic, radial, ulnar and upper cephalic vein. Spectral Doppler was utilized to evaluate flow at rest and with distal augmentation maneuvers. COMPARISON:  None. FINDINGS: Contralateral Subclavian Vein: Respiratory phasicity is normal and symmetric with the symptomatic side. No evidence of thrombus. Normal compressibility. Internal Jugular Vein: There is a large partially occlusive thrombus in the right internal jugular vein  surrounding the catheter. There is extension of the thrombus into the IJ/subclavian vein junction. Subclavian Vein: No evidence of thrombus. Normal compressibility, respiratory phasicity and response to augmentation. Axillary Vein: No evidence of thrombus. Normal compressibility, respiratory phasicity and response to augmentation. Cephalic Vein: No evidence of thrombus. Normal compressibility, respiratory phasicity and response to augmentation. Basilic Vein: No evidence of thrombus. Normal compressibility, respiratory phasicity and response to augmentation. Brachial Veins: No evidence of thrombus. Normal compressibility, respiratory phasicity and response to augmentation. Radial Veins: No evidence of thrombus. Normal compressibility, respiratory phasicity and response to augmentation. Ulnar Veins: No evidence of thrombus. Normal compressibility, respiratory phasicity and response to augmentation. Venous Reflux:  None visualized. Other Findings: There is a heterogeneous and large right thyroid nodule with areas of calcification. Dedicated thyroid ultrasound is recommended for further evaluation. IMPRESSION: Partially occlusive thrombus in the right IJ surrounding the port with extension into the IJ/subclavian junction. Electronically Signed: By: Anner Crete M.D. On: 01/11/2016 18:16   Ct Outside Films Chest  12/26/2015  This examination belongs to an outside facility and is stored here for comparison purposes only.  Contact the originating outside institution for any associated report or interpretation.   ASSESSMENT & PLAN:   # METASTATIC PANCREATIC CANCER/Stage IV- Metastatic to the liver. On palliative chemotherapy with gemcitabine and Abraxane. Status post cycle 2. HELD cycle #3 yesterday Severe anemia; [see discussion below]  # Severe anemia hemoglobin 5.8- Iron deficient; I suspect Patient has GI bleed; Although chronic.s/p 2 units PRBC; today Hb 8; awaiting on EGD today.  # Right upper  extremity DVT/ IJ- Secondary to port. On Xarelto; will HOLD Anticoagulation in view of possible GI bleed.   #  Overall prognosis is poor.  We'll continue to follow in the hospital.  Thank you Dr. Tressia Miners for allowing me to participate in the care of your pleasant patient. Please do not hesitate to contact me with questions or concerns in the interim.      Cammie Sickle, MD 01/25/2016 8:18 AM

## 2016-01-25 NOTE — Discharge Instructions (Signed)
NO ASPIRIN ATLEAST FOR A WEEK  LIQUID DIET TODAY AND ADVANCE TO SOFT DIET TOMORROW.

## 2016-01-25 NOTE — Anesthesia Postprocedure Evaluation (Signed)
Anesthesia Post Note  Patient: Nathaniel Graham.  Procedure(s) Performed: Procedure(s) (LRB): ESOPHAGOGASTRODUODENOSCOPY (EGD) (N/A)  Patient location during evaluation: PACU Anesthesia Type: General Level of consciousness: awake and alert and oriented Pain management: pain level controlled Vital Signs Assessment: post-procedure vital signs reviewed and stable Respiratory status: spontaneous breathing Cardiovascular status: blood pressure returned to baseline Postop Assessment: no headache Anesthetic complications: no    Last Vitals:  Filed Vitals:   01/25/16 1230 01/25/16 1329  BP: 150/83 148/71  Pulse: 99 107  Temp:  37.2 C  Resp: 19 18    Last Pain:  Filed Vitals:   01/25/16 1329  PainSc: Asleep                 Chrishauna Mee

## 2016-01-25 NOTE — Progress Notes (Signed)
Initial Nutrition Assessment  DOCUMENTATION CODES:   Severe malnutrition in context of chronic illness  INTERVENTION:   Coordination of Care: await diet progression as medically able Medical Food Supplement Therapy: Recommend Ensure Enlive po BID, each supplement provides 350 kcal and 20 grams of protein, once diet order able to be advanced. Pt reports not liking chocolate prefers vanilla.   NUTRITION DIAGNOSIS:   Malnutrition related to chronic illness as evidenced by energy intake < or equal to 75% for > or equal to 1 month, severe depletion of muscle mass, severe depletion of body fat.  GOAL:   Patient will meet greater than or equal to 90% of their needs  MONITOR:   PO intake, Diet advancement, Supplement acceptance, Labs, Weight trends, I & O's  REASON FOR ASSESSMENT:   Consult Poor PO  ASSESSMENT:   Pt admitted with acute anemia and GI bleed, scheduled for EGD 01/25/2016. Pt with known h/o pancreatic cancer with liver mets.   Past Medical History  Diagnosis Date  . Hypertension   . Diabetes mellitus without complication ([place])   . Iron deficiency anemia   . Pancreatic cancer ([place]) 12/15/2015  . Blood dyscrasia   . Intractable hiccups 01/02/2016  . Liver cancer ([place])   . Thrombus     in right IJ after medport placement in Feb 2017    Diet Order:  Diet NPO time specified Except for: Sips with Meds   Current Nutrition: Pt currently NPO  Food/Nutrition-Related History: Pt reports his appetite has been "fair."  Pt reports usually eating at [facility] twice daily but has not been able to finish his meals for a while now, he reports >few weeks. Pt reports not regularly drinking Ensure but likes vanilla flavored ones.   Scheduled Medications:  . fentaNYL  25 mcg Transdermal Q72H  . insulin aspart  0-9 Units Subcutaneous TID WC  . lisinopril  20 mg Oral Daily  . metFORMIN  1,000 mg Oral BID WC  . nicotine  21 mg Transdermal Daily  . pantoprazole (PROTONIX) IV  40  mg Intravenous Q12H  . simvastatin  10 mg Oral Daily     Electrolyte/Renal Profile and Glucose Profile:   Recent Labs Lab 01/24/16 0850 01/24/16 1036 01/25/16 0549  NA 132* 133* 133*  K 3.7 3.8 3.8  CL 102 102 104  CO2 26 27 26   BUN 22* 21* 14  CREATININE 0.89 0.81 0.61  CALCIUM 8.2* 8.6* 8.2*  GLUCOSE 343* 316* 134*   Protein Profile:  Recent Labs Lab 01/24/16 0850 01/24/16 1036  ALBUMIN 2.7* 2.7*   Nutritional Anemia Profile:  CBC Latest Ref Rng 01/25/2016 01/24/2016 01/24/2016  WBC 3.8 - 10.6 K/uL 17.9(H) 16.7(H) 16.4(H)  Hemoglobin 13.0 - 18.0 g/dL 8.1(L) 5.5(L) 5.5(L)  Hematocrit 40.0 - 52.0 % 24.8(L) 17.5(L) 18.2(L)  Platelets 150 - 440 K/uL 230 265 267    Gastrointestinal Profile: Last BM: 01/24/2016   Nutrition-Focused Physical Exam Findings: Nutrition-Focused physical exam completed. Findings are moderate-severe fat depletion, moderate-severe muscle depletion, and no edema.     Weight Change: Pt reports weight of 166lbs yesterday when he was weighed and that his weight fluctuates. Pt weight relatively stable with slight fluctuation over the past month per [facility] weight encounters.   Height:   Ht Readings from Last 1 Encounters:  01/24/16 5\' 4"  (1.626 m)    Weight:   Wt Readings from Last 1 Encounters:  01/24/16 141 lb 14.4 oz (64.365 kg)   Wt Readings from Last 10 Encounters:  01/24/16 141 lb 14.4 oz (64.365 kg)  01/24/16 134 lb 4.2 oz (60.9 kg)  01/10/16 138 lb 10.7 oz (62.9 kg)  12/27/15 135 lb 4 oz (61.35 kg)  12/26/15 147 lb (66.679 kg)  12/20/15 139 lb 15.9 oz (63.5 kg)  12/15/15 147 lb (66.679 kg)    BMI:  Body mass index is 24.34 kg/(m^2).  Estimated Nutritional Needs:   Kcal:  BEE: 1316kcals, TEE: (IF 1.1-1.3)(AF 1.3) [id number]  Protein:  70-83g protein (1.1-1.3g/kg)  Fluid:  1600-1932mL of fluid (25-47mL/kg)  EDUCATION NEEDS:   No education needs identified at this time   [place], RD, LDN Pager  [phone] Weekend/On-Call Pager [phone]

## 2016-01-25 NOTE — Anesthesia Preprocedure Evaluation (Addendum)
Anesthesia Evaluation  Patient identified by MRN, date of birth, ID band Patient awake    Reviewed: Allergy & Precautions, NPO status , Patient's Chart, lab work & pertinent test results  Airway Mallampati: II  TM Distance: >3 FB Neck ROM: Full    Dental  (+) Upper Dentures, Chipped   Pulmonary Current Smoker,    Pulmonary exam normal breath sounds clear to auscultation       Cardiovascular hypertension, Pt. on medications Normal cardiovascular exam     Neuro/Psych negative neurological ROS  negative psych ROS   GI/Hepatic Liver CA Pancreatic and liver CA   Endo/Other  diabetes, Well Controlled, Type 2, Oral Hypoglycemic Agents, Insulin Dependent  Renal/GU negative Renal ROS     Musculoskeletal negative musculoskeletal ROS (+)   Abdominal Normal abdominal exam  (+)   Peds  Hematology  (+) anemia ,   Anesthesia Other Findings   Reproductive/Obstetrics                            Anesthesia Physical Anesthesia Plan  ASA: III  Anesthesia Plan: General   Post-op Pain Management:    Induction: Intravenous  Airway Management Planned: Nasal Cannula  Additional Equipment:   Intra-op Plan:   Post-operative Plan:   Informed Consent: I have reviewed the patients History and Physical, chart, labs and discussed the procedure including the risks, benefits and alternatives for the proposed anesthesia with the patient or authorized representative who has indicated his/her understanding and acceptance.   Dental advisory given  Plan Discussed with: CRNA and Surgeon  Anesthesia Plan Comments:         Anesthesia Quick Evaluation

## 2016-01-26 LAB — CANCER ANTIGEN 19-9: CA 19 9: 4676 U/mL — AB (ref 0–35)

## 2016-01-30 ENCOUNTER — Other Ambulatory Visit: Payer: Self-pay | Admitting: *Deleted

## 2016-01-30 DIAGNOSIS — R066 Hiccough: Secondary | ICD-10-CM

## 2016-01-30 DIAGNOSIS — C252 Malignant neoplasm of tail of pancreas: Secondary | ICD-10-CM

## 2016-01-30 MED ORDER — CHLORPROMAZINE HCL 25 MG PO TABS: 25.0000 mg | ORAL_TABLET | Freq: Four times a day (QID) | ORAL | Status: AC | PRN

## 2016-01-30 NOTE — Progress Notes (Signed)
Received fax request for RFs to be placed on file for the thorazine. Prescription RF sent to [facility] via escribe.

## 2016-01-31 ENCOUNTER — Telehealth: Payer: Self-pay | Admitting: *Deleted

## 2016-01-31 ENCOUNTER — Ambulatory Visit (HOSPITAL_BASED_OUTPATIENT_CLINIC_OR_DEPARTMENT_OTHER): Payer: Medicare HMO | Admitting: Internal Medicine

## 2016-01-31 ENCOUNTER — Encounter: Payer: Self-pay | Admitting: Internal Medicine

## 2016-01-31 ENCOUNTER — Ambulatory Visit: Payer: Medicare HMO

## 2016-01-31 ENCOUNTER — Inpatient Hospital Stay: Payer: Medicare HMO

## 2016-01-31 VITALS — HR 121 | Temp 97.4°F | Resp 18 | Ht 64.0 in | Wt 135.4 lb

## 2016-01-31 DIAGNOSIS — I10 Essential (primary) hypertension: Secondary | ICD-10-CM | POA: Diagnosis not present

## 2016-01-31 DIAGNOSIS — R531 Weakness: Secondary | ICD-10-CM

## 2016-01-31 DIAGNOSIS — Z86718 Personal history of other venous thrombosis and embolism: Secondary | ICD-10-CM | POA: Diagnosis not present

## 2016-01-31 DIAGNOSIS — D509 Iron deficiency anemia, unspecified: Secondary | ICD-10-CM

## 2016-01-31 DIAGNOSIS — C252 Malignant neoplasm of tail of pancreas: Secondary | ICD-10-CM

## 2016-01-31 DIAGNOSIS — D649 Anemia, unspecified: Secondary | ICD-10-CM

## 2016-01-31 DIAGNOSIS — R066 Hiccough: Secondary | ICD-10-CM

## 2016-01-31 DIAGNOSIS — Z79899 Other long term (current) drug therapy: Secondary | ICD-10-CM | POA: Diagnosis not present

## 2016-01-31 DIAGNOSIS — C787 Secondary malignant neoplasm of liver and intrahepatic bile duct: Secondary | ICD-10-CM

## 2016-01-31 DIAGNOSIS — E119 Type 2 diabetes mellitus without complications: Secondary | ICD-10-CM | POA: Diagnosis not present

## 2016-01-31 LAB — CBC WITH DIFFERENTIAL/PLATELET
BASOS ABS: 0.1 10*3/uL (ref 0–0.1)
BASOS PCT: 1 %
EOS PCT: 3 %
Eosinophils Absolute: 0.4 10*3/uL (ref 0–0.7)
HCT: 28 % — ABNORMAL LOW (ref 40.0–52.0)
Hemoglobin: 8.7 g/dL — ABNORMAL LOW (ref 13.0–18.0)
Lymphocytes Relative: 6 %
Lymphs Abs: 0.7 10*3/uL — ABNORMAL LOW (ref 1.0–3.6)
MCH: 26.3 pg (ref 26.0–34.0)
MCHC: 31.1 g/dL — ABNORMAL LOW (ref 32.0–36.0)
MCV: 84.3 fL (ref 80.0–100.0)
MONO ABS: 1.2 10*3/uL — AB (ref 0.2–1.0)
Monocytes Relative: 11 %
Neutro Abs: 9 10*3/uL — ABNORMAL HIGH (ref 1.4–6.5)
Neutrophils Relative %: 79 %
PLATELETS: 139 10*3/uL — AB (ref 150–440)
RBC: 3.32 MIL/uL — ABNORMAL LOW (ref 4.40–5.90)
RDW: 19.7 % — AB (ref 11.5–14.5)
WBC: 11.4 10*3/uL — ABNORMAL HIGH (ref 3.8–10.6)

## 2016-01-31 LAB — COMPREHENSIVE METABOLIC PANEL
ALBUMIN: 2.5 g/dL — AB (ref 3.5–5.0)
ALT: 26 U/L (ref 17–63)
AST: 23 U/L (ref 15–41)
Alkaline Phosphatase: 615 U/L — ABNORMAL HIGH (ref 38–126)
Anion gap: 5 (ref 5–15)
BUN: 16 mg/dL (ref 6–20)
CHLORIDE: 94 mmol/L — AB (ref 101–111)
CO2: 26 mmol/L (ref 22–32)
Calcium: 8.1 mg/dL — ABNORMAL LOW (ref 8.9–10.3)
Creatinine, Ser: 0.78 mg/dL (ref 0.61–1.24)
GFR calc Af Amer: >60 mL/min (ref >60)
Glucose, Bld: 398 mg/dL — ABNORMAL HIGH (ref 65–99)
POTASSIUM: 3.9 mmol/L (ref 3.5–5.1)
Sodium: 125 mmol/L — ABNORMAL LOW (ref 135–145)
Total Bilirubin: 0.8 mg/dL (ref 0.3–1.2)
Total Protein: 5.8 g/dL — ABNORMAL LOW (ref 6.5–8.1)

## 2016-01-31 MED ORDER — SODIUM CHLORIDE 0.9% FLUSH
10.0000 mL | INTRAVENOUS | Status: DC | PRN
  Administered 2016-01-31: 10 mL via INTRAVENOUS
  Filled 2016-01-31: qty 10

## 2016-01-31 MED ORDER — HEPARIN SOD (PORK) LOCK FLUSH 100 UNIT/ML IV SOLN
500.0000 [IU] | Freq: Once | INTRAVENOUS | Status: AC
  Administered 2016-01-31: 500 [IU] via INTRAVENOUS
  Filled 2016-01-31: qty 5

## 2016-01-31 NOTE — Telephone Encounter (Signed)
md to attempt call back now.

## 2016-01-31 NOTE — Progress Notes (Signed)
I spoke to patient's daughter at length regarding the progression of the disease/declining performance status; and complicating multiple issues including severe anemia/probable GI bleed- and need for anticoagulation for the port-related clot. Discussed regarding hospice. She agrees. However if there are looking for a second opinion I would recommend local evaluation at [facility] or [facility]. Also discussed it is not a candidate for any radiation therapy or surgery given his metastases to the liver. She feels that her father will likely not choose to go for a second opinion. The daughter will call us if she needs any information.

## 2016-01-31 NOTE — Telephone Encounter (Signed)
Patient's daughter called and stated is calling Dr. Rogue Bussing back to discuss pt's condition. Please callback.

## 2016-01-31 NOTE — Telephone Encounter (Signed)
md states that he contacted daughter back per request. Patient gave verbal approval to contact daughter. MD discussed tx goals & prognosis with daughter. Referral initiated to hospice services. Also faxed order to Dr. Bunnie Domino office to have office schedule port a cath removed due to clot near port a cath. Pt is no longer on xarelto due to bleed/anemia.

## 2016-01-31 NOTE — Progress Notes (Signed)
PT here for chemo today. Pt states he feels ok. He is drinking better per pt but not eating better. Mostly he does not get hungry but when he does eat things do not taste good to him.  His bowels are working good.  Caregiver states he still picks and chooses what meds he will take. No pain today

## 2016-01-31 NOTE — Progress Notes (Signed)
[facility] NOTE  Patient Care Team: Perrin Maltese, MD as PCP - General (Internal Medicine) Clent Jacks, RN as Registered Nurse  Dr. Olivia Mackie Mclean-Scocuzza MD  CHIEF COMPLAINTS/PURPOSE OF CONSULTATION: Pancreatic cancer   ONCOLOGIC HISTORY:  # FEB 2017- PANCREATIC [place] CA [s/p EUS-Liver bx];EUS- multiple liver lesions [left lobe 34x52mm]; Pancreatic tail mass [49x34mm];Jan2017-  CT C/A/P- 6cm pan tail mass; multiple liver lesions; Lungs- neg;   # Iron def Anemia- ? Etiology- start Iv iron.   #feb 2017 R UE DVT/IJ- off xarelto -March17th 2017]  # Smoking/diabetes  HISTORY OF PRESENTING ILLNESS:  Nathaniel Graham. 70 y.o.  male with above  history of metastatic pancreatic cancer on palliative chemotherapy - gemcitabine and Abraxane status post 2 cycle   Cycle #3 was held a week ago because of severe anemia/hospitalization. EGD showed no source of bleeding. He was discharged home after 2 units of PRBC. Patient is currently off Xarelto  Today patient condition every weak/tired.  Denies any dizziness. Denies any blood in stools or black stools. His abdominal pain is well-controlled.  His hiccups are improved since being on Thorazine. Denies any tingling and numbness of the extremities.   Patient has chronic cough;  Not getting any worse.- he is a smoker.  No diarrhea but constipation.  ROS: A complete 10 point review of system is done which is negative except mentioned above in history of present illness  MEDICAL HISTORY:  Past Medical History  Diagnosis Date  . Hypertension   . Diabetes mellitus without complication ([place])   . Iron deficiency anemia   . Blood dyscrasia   . Intractable hiccups 01/02/2016  . Thrombus     in right IJ after medport placement in Feb 2017    SURGICAL HISTORY: Past Surgical History  Procedure Laterality Date  . Upper esophageal endoscopic ultrasound (eus) N/A 12/15/2015    Procedure: UPPER ESOPHAGEAL ENDOSCOPIC  ULTRASOUND (EUS);  Surgeon: Cora Daniels, MD;  Location: [facility] ENDOSCOPY;  Service: Endoscopy;  Laterality: N/A;  . Cataract surgery Bilateral   . Peripheral vascular catheterization N/A 12/26/2015    Procedure: Porta Cath Insertion;  Surgeon: Algernon Huxley, MD;  Location: [facility] CV LAB;  Service: Cardiovascular;  Laterality: N/A;    SOCIAL HISTORY: denies any alcohol. Lives with his significant other at home in [place]; used to work as Dealer.  Social History   Social History  . Marital Status: Single    Spouse Name: N/A  . Number of Children: N/A  . Years of Education: N/A   Occupational History  . Not on file.   Social History Main Topics  . Smoking status: Current Every Day Smoker -- 0.25 packs/day for 30 years    Types: Cigarettes  . Smokeless tobacco: Never Used     Comment: smokes 3-4 cigarettes a day "STARTED SMOKING LATE TEENS"  . Alcohol Use: No  . Drug Use: No  . Sexual Activity:    Partners: Female   Other Topics Concern  . Not on file   Social History Narrative    FAMILY HISTORY: Family History  Problem Relation Age of Onset  . Diabetes Mellitus II Mother   . Osteoarthritis Father     ALLERGIES:  has No Known Allergies.  MEDICATIONS:  Current Outpatient Prescriptions  Medication Sig Dispense Refill  . chlorproMAZINE (THORAZINE) 25 MG tablet Take 1 tablet (25 mg total) by mouth 4 (four) times daily as needed. 30 tablet 1  . fentaNYL (  DURAGESIC - DOSED MCG/HR) 25 MCG/HR patch Place 1 patch (25 mcg total) onto the skin every 3 (three) days. 5 patch 0  . ferrous fumarate (HEMOCYTE - 106 MG FE) 325 (106 Fe) MG TABS tablet Take 1 tablet by mouth daily.     Marland Kitchen glimepiride (AMARYL) 1 MG tablet Take 1 tablet by mouth 2 (two) times daily.    Marland Kitchen lidocaine-prilocaine (EMLA) cream Apply 1 application topically as needed. 30 g 6  . lisinopril (PRINIVIL,ZESTRIL) 20 MG tablet Take 1 tablet by mouth daily.    . metFORMIN (GLUCOPHAGE) 1000 MG tablet Take  1,000 mg by mouth 2 (two) times daily with a meal.    . oxyCODONE-acetaminophen (PERCOCET) 7.5-325 MG tablet Take 1 tablet by mouth every 8 (eight) hours as needed.  0  . pantoprazole (PROTONIX) 40 MG tablet Take 40 mg by mouth daily.    . polyethylene glycol powder (GLYCOLAX/MIRALAX) powder Take 17 g by mouth as needed. FOR CONSTIPATION    . simvastatin (ZOCOR) 10 MG tablet Take 10 mg by mouth daily.     No current facility-administered medications for this visit.   Facility-Administered Medications Ordered in Other Visits  Medication Dose Route Frequency Provider Last Rate Last Dose  . heparin lock flush 100 unit/mL  500 Units Intravenous Once Cammie Sickle, MD      . heparin lock flush 100 unit/mL  500 Units Intravenous Once Cammie Sickle, MD      . sodium chloride flush (NS) 0.9 % injection 10 mL  10 mL Intravenous PRN Cammie Sickle, MD   10 mL at 01/24/16 0847  . sodium chloride flush (NS) 0.9 % injection 10 mL  10 mL Intravenous PRN Cammie Sickle, MD      . sodium chloride flush (NS) 0.9 % injection 3 mL  3 mL Intracatheter PRN Cammie Sickle, MD          .  PHYSICAL EXAMINATION: ECOG PERFORMANCE STATUS: 3 - Symptomatic, >50% confined to bed  Filed Vitals:   01/31/16 0858  Pulse: 121  Temp: 97.4 F (36.3 C)  Resp: 18   Filed Weights   01/31/16 0858  Weight: 135 lb 5.8 oz (61.4 kg)    GENERAL:  Thin built moderately nourished African-American male patient.  Not in any distress.  Accompanied by family. He is in wheel chair. EYES:positive for palor.  OROPHARYNX: no thrush or ulceration; poor dentition  NECK: supple, no masses felt LYMPH:  no palpable lymphadenopathy in the cervical, axillary or inguinal regions LUNGS: decreased breath sounds to auscultation and  No wheeze or crackles HEART/CVS: regular rate & rhythm and no murmurs; bil ankle swelling ABDOMEN: abdomen soft, non-tender and normal bowel sounds Musculoskeletal:no cyanosis of  digits and no clubbing  PSYCH: alert & oriented x 3 with fluent speech NEURO: no focal motor/sensory deficits SKIN:  no rashes or significant lesions  LABORATORY DATA:  I have reviewed the data as listed Lab Results  Component Value Date   WBC 11.4* 01/31/2016   HGB 8.7* 01/31/2016   HCT 28.0* 01/31/2016   MCV 84.3 01/31/2016   PLT 139* 01/31/2016    Recent Labs  01/24/16 0850 01/24/16 1036 01/25/16 0549 01/31/16 0824  NA 132* 133* 133* 125*  K 3.7 3.8 3.8 3.9  CL 102 102 104 94*  CO2 26 27 26 26   GLUCOSE 343* 316* 134* 398*  BUN 22* 21* 14 16  CREATININE 0.89 0.81 0.61 0.78  CALCIUM 8.2* 8.6* 8.2* 8.1*  GFRNONAA >60 >60 >60 >60  GFRAA >60 >60 >60 >60  PROT 5.8* 5.7*  --  5.8*  ALBUMIN 2.7* 2.7*  --  2.5*  AST 25 27  --  23  ALT 32 32  --  26  ALKPHOS 644* 665*  --  615*  BILITOT 0.6 0.5  --  0.8      ASSESSMENT & PLAN:   # 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 On palliative chemotherapy with gemcitabine and Abraxane. Status post cycle 2. Patient's CA 19 9 has gone up from 2975 to 4676.  Progression/poor performance status.     I think overall patient has also poor tolerance to chemotherapy/ without an improvement in performance status- and multiple co- morbidities [C discussion below]- I recommend hospice. I think further chemotherapy is unlikely to improve his symptoms/quality of life or overall survival. # I reviewed the hospice philosophy in detail; discussed that the life expectancy as less than 6 months.   #  Severe anemia hemoglobin 5.8-  Iron deficient; EGD negative. Status post transfusion hemoglobin of 8.3. We will discontinue Xarelto.  #  Right upper extremity DVT/ IJ-  Secondary to port.  Xarelto was discontinued last week. I recommend explantation of the port.  #  Pain control-  Recommend continued use of fentanyl 25 g;  Continue Percocet every 8 hours.Under control. Continue current pain medication.   # Hicupps- question  related to chemotherapy status post Thorazine; resolved.   # Offered to talk to the patient's daughter; patient/family will let us know. We'll keep the follow-up appointments open/only as needed await hospice evaluation.  # 40 minutes face-to-face with the patient discussing the above plan of care; more than 50% of time spent on prognosis/ natural history; counseling and coordination.     Cammie Sickle, MD 01/31/2016 9:15 AM

## 2016-02-01 ENCOUNTER — Encounter: Payer: Self-pay | Admitting: Unknown Physician Specialty

## 2016-02-02 ENCOUNTER — Telehealth: Payer: Self-pay | Admitting: *Deleted

## 2016-02-02 NOTE — Telephone Encounter (Signed)
Daughter calling to see if we had got an appt yet to have port removed.  Her father spoke with her today and they have not heard back about appt.  I called and spoke to Nathaniel Graham at Dr. Lucky Cowboy office and she states that the only number she has does not allow to leave message and no one answers it.  I gave her the pt #, girlfriend # and the daughter Nathaniel Graham's #.  I called Nathaniel Graham back to explain that the office has been trying to call and the phone they are trying does not allow a message to be left.  I told her that I gave her all 3 numbers and try them til she gets someone.  I did tell her that Nathaniel Graham states they can see him in the office on Monday and then sch. The port to be removed.  Nathaniel Graham states she will let her Dad know and he told her this morning that he may want a second opinion at [facility] and she asked if he should keep his port in if that is what he chooses to do.  I told her yes, that he should keep port in if he feels like he still wants a treatment of some kind.  I also said that while he is in hospice that the nurses can come out to his house and flush his port once every 6 weeks.  Our office would just need to give them an order.  She will speak to Dad with this info and call us if needed.

## 2016-02-29 ENCOUNTER — Other Ambulatory Visit: Payer: Self-pay | Admitting: Internal Medicine

## 2016-02-29 ENCOUNTER — Telehealth: Payer: Self-pay | Admitting: Internal Medicine

## 2016-02-29 DIAGNOSIS — C787 Secondary malignant neoplasm of liver and intrahepatic bile duct: Principal | ICD-10-CM

## 2016-02-29 DIAGNOSIS — M79601 Pain in right arm: Secondary | ICD-10-CM

## 2016-02-29 DIAGNOSIS — C259 Malignant neoplasm of pancreas, unspecified: Secondary | ICD-10-CM

## 2016-02-29 MED ORDER — FENTANYL 50 MCG/HR TD PT72: 50.0000 ug | MEDICATED_PATCH | TRANSDERMAL | Status: DC

## 2016-02-29 MED ORDER — MORPHINE SULFATE ER 30 MG PO TBCR: 30.0000 mg | EXTENDED_RELEASE_TABLET | Freq: Two times a day (BID) | ORAL | Status: AC

## 2016-02-29 NOTE — Telephone Encounter (Signed)
md personally spoke with [place]. Pt will be prescribed MSContin 30 mg

## 2016-02-29 NOTE — Telephone Encounter (Signed)
I spoke with Nathaniel Graham who stated she does not think the patch is working for pt as he has no [place] fat for absorption. Asking if you want to change him to an oral LA med or increase patch dose if that is your choice. He reports severe nausea with Percocet. Please advise

## 2016-02-29 NOTE — Progress Notes (Signed)
Spoke to hospice nurse patient on fentanyl 25 g. Cachectic. Discontinue fentanyl start MS Contin 30 mg twice a day. Continue 7.5/325 hydrocodone as needed. Also recommend Compazine 10 mg every 6-8 hours.

## 2016-02-29 NOTE — Telephone Encounter (Signed)
Patient either needs a refill on Fentanyl or a change of Rx. He reports pain is 7-8 out of 10. Percocet makes him nauseated. He is currently 119 lbs. And BMI is 20.4. Colletta [place] said you can call her at [phone] (or possibly 76 - msg ran off page and can't see if it's a 7 or 1.)

## 2016-03-08 ENCOUNTER — Emergency Department

## 2016-03-08 ENCOUNTER — Encounter: Payer: Self-pay | Admitting: Emergency Medicine

## 2016-03-08 ENCOUNTER — Emergency Department
Admission: EM | Admit: 2016-03-08 | Discharge: 2016-03-09 | Disposition: A | Discharge status: Home / Self Care | Attending: Emergency Medicine | Admitting: Emergency Medicine

## 2016-03-08 DIAGNOSIS — K7682 Hepatic encephalopathy: Secondary | ICD-10-CM

## 2016-03-08 DIAGNOSIS — T887XXA Unspecified adverse effect of drug or medicament, initial encounter: Secondary | ICD-10-CM | POA: Insufficient documentation

## 2016-03-08 DIAGNOSIS — Z859 Personal history of malignant neoplasm, unspecified: Secondary | ICD-10-CM | POA: Insufficient documentation

## 2016-03-08 DIAGNOSIS — Z7984 Long term (current) use of oral hypoglycemic drugs: Secondary | ICD-10-CM | POA: Diagnosis not present

## 2016-03-08 DIAGNOSIS — I10 Essential (primary) hypertension: Secondary | ICD-10-CM | POA: Insufficient documentation

## 2016-03-08 DIAGNOSIS — T507X5A Adverse effect of analeptics and opioid receptor antagonists, initial encounter: Secondary | ICD-10-CM | POA: Diagnosis present

## 2016-03-08 DIAGNOSIS — W19XXXA Unspecified fall, initial encounter: Secondary | ICD-10-CM

## 2016-03-08 DIAGNOSIS — K7290 Hepatic failure, unspecified without coma: Secondary | ICD-10-CM

## 2016-03-08 DIAGNOSIS — Y69 Unspecified misadventure during surgical and medical care: Secondary | ICD-10-CM | POA: Diagnosis not present

## 2016-03-08 DIAGNOSIS — Z79899 Other long term (current) drug therapy: Secondary | ICD-10-CM | POA: Diagnosis not present

## 2016-03-08 DIAGNOSIS — C252 Malignant neoplasm of tail of pancreas: Secondary | ICD-10-CM | POA: Insufficient documentation

## 2016-03-08 HISTORY — DX: Malignant (primary) neoplasm, unspecified: C80.1

## 2016-03-08 LAB — COMPREHENSIVE METABOLIC PANEL
ALBUMIN: 2.5 g/dL — AB (ref 3.5–5.0)
ALK PHOS: 1145 U/L — AB (ref 38–126)
ALT: 89 U/L — AB (ref 17–63)
AST: 71 U/L — AB (ref 15–41)
Anion gap: 11 (ref 5–15)
BILIRUBIN TOTAL: 6.4 mg/dL — AB (ref 0.3–1.2)
BUN: 18 mg/dL (ref 6–20)
CALCIUM: 8.9 mg/dL (ref 8.9–10.3)
CO2: 26 mmol/L (ref 22–32)
CREATININE: 0.54 mg/dL — AB (ref 0.61–1.24)
Chloride: 96 mmol/L — ABNORMAL LOW (ref 101–111)
GFR calc Af Amer: >60 mL/min (ref >60)
GLUCOSE: 308 mg/dL — AB (ref 65–99)
POTASSIUM: 4.5 mmol/L (ref 3.5–5.1)
Sodium: 133 mmol/L — ABNORMAL LOW (ref 135–145)
TOTAL PROTEIN: 6.7 g/dL (ref 6.5–8.1)

## 2016-03-08 LAB — LIPASE, BLOOD: Lipase: 74 U/L — ABNORMAL HIGH (ref 11–51)

## 2016-03-08 LAB — LACTIC ACID, PLASMA: LACTIC ACID, VENOUS: 2.2 mmol/L — AB (ref 0.5–2.0)

## 2016-03-08 LAB — ETHANOL

## 2016-03-08 NOTE — ED Provider Notes (Addendum)
[facility] Emergency Department Provider Note  ____________________________________________   I have reviewed the triage vital signs and the nursing notes.   HISTORY  Chief Complaint Fall and Drug Overdose    HPI Nathaniel Graham. is a 70 y.o. 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 He was in his normal state of health when he went to bed according to family and then he "rolled out of bed". EMS was called because he was having a little bit of trouble waking up, at that time, they thought that he had pinpoint pupils and they gave him intranasal Narcan which woke him up in a hurry. Patient has no complaints at this time, he is somewhat irritated about receiving Narcan. Family believes to be at his baseline at this time. Of note, patient is supposed to be on home oxygen but according to family "prefers to smoke".     Past Medical History  Diagnosis Date  . Hypertension   . Diabetes mellitus without complication ([place])   . Iron deficiency anemia   . Blood dyscrasia   . Intractable hiccups 01/02/2016  . Thrombus     in right IJ after [facility] placement in Feb 2017  . Cancer [facility])     Patient Active Problem List   Diagnosis Date Noted  . Protein-calorie malnutrition, severe 01/25/2016  . Malignant neoplasm of tail of pancreas ([place])   . GI bleed 01/24/2016  . Blood loss anemia 01/24/2016  . Intractable hiccups 01/02/2016  . Pancreatic cancer ([place]) 12/21/2015  . Iron deficiency anemia 12/21/2015    Past Surgical History  Procedure Laterality Date  . Upper esophageal endoscopic ultrasound (eus) N/A 12/15/2015    Procedure: UPPER ESOPHAGEAL ENDOSCOPIC ULTRASOUND (EUS);  Surgeon: Cora Daniels, MD;  Location: [facility] ENDOSCOPY;   Service: Endoscopy;  Laterality: N/A;  . Cataract surgery Bilateral   . Peripheral vascular catheterization N/A 12/26/2015    Procedure: Porta Cath Insertion;  Surgeon: Algernon Huxley, MD;  Location: [place] CV LAB;  Service: Cardiovascular;  Laterality: N/A;  . Esophagogastroduodenoscopy N/A 01/25/2016    Procedure: ESOPHAGOGASTRODUODENOSCOPY (EGD);  Surgeon: Manya Silvas, MD;  Location: [facility] ENDOSCOPY;  Service: Endoscopy;  Laterality: N/A;  zvm's cauterized with erbe    Current Outpatient Rx  Name  Route  Sig  Dispense  Refill  . chlorproMAZINE (THORAZINE) 25 MG tablet   Oral   Take 1 tablet (25 mg total) by mouth 4 (four) times daily as needed.   30 tablet   1     Elease Etienne, social worker approved copay to be pl ...   . ferrous fumarate (HEMOCYTE - 106 MG FE) 325 (106 Fe) MG TABS tablet   Oral   Take 1 tablet by mouth daily.          Marland Kitchen glimepiride (AMARYL) 1 MG tablet   Oral   Take 1 tablet by mouth 2 (two) times daily.         Marland Kitchen lidocaine-prilocaine (EMLA) cream   Topical   Apply 1 application topically as needed.   30 g   6   . lisinopril (PRINIVIL,ZESTRIL) 20 MG tablet   Oral   Take 1 tablet by mouth daily.         . metFORMIN (GLUCOPHAGE) 1000 MG tablet  Oral   Take 1,000 mg by mouth 2 (two) times daily with a meal.         . morphine (MS CONTIN) 30 MG 12 hr tablet   Oral   Take 1 tablet (30 mg total) by mouth every 12 (twelve) hours.   60 tablet   0     Hospice pt.   . oxyCODONE-acetaminophen (PERCOCET) 7.5-325 MG tablet   Oral   Take 1 tablet by mouth every 8 (eight) hours as needed.      0   . pantoprazole (PROTONIX) 40 MG tablet   Oral   Take 40 mg by mouth daily.         . polyethylene glycol powder (GLYCOLAX/MIRALAX) powder   Oral   Take 17 g by mouth as needed. FOR CONSTIPATION         . simvastatin (ZOCOR) 10 MG tablet   Oral   Take 10 mg by mouth daily.           Allergies Review of patient's allergies  indicates no known allergies.  Family History  Problem Relation Age of Onset  . Diabetes Mellitus II Mother   . Osteoarthritis Father     Social History Social History  Substance Use Topics  . Smoking status: Current Every Day Smoker -- 0.25 packs/day for 30 years    Types: Cigarettes  . Smokeless tobacco: Never Used     Comment: smokes 3-4 cigarettes a day "STARTED SMOKING LATE TEENS"  . Alcohol Use: No    Review of Systems Constitutional: No fever/chills Eyes: No visual changes. ENT: No sore throat. No stiff neck no neck pain Cardiovascular: Denies chest pain. Respiratory: Denies shortness of breath. Gastrointestinal:   no vomiting.  No diarrhea.  No constipation. Genitourinary: Negative for dysuria. Musculoskeletal: Negative lower extremity swelling Skin: Negative for rash. Neurological: Negative for headaches, focal weakness or numbness. 10-point ROS otherwise negative.  ____________________________________________   PHYSICAL EXAM:  VITAL SIGNS: ED Triage Vitals  Enc Vitals Group     BP 03/08/16 2215 184/99 mmHg     Pulse Rate 03/08/16 2215 118     Resp 03/08/16 2215 27     Temp 03/08/16 2215 97.9 F (36.6 C)     Temp Source 03/08/16 2215 Axillary     SpO2 --      Weight 03/08/16 2215 132 lb (59.875 kg)     Height 03/08/16 2215 5\' 5"  (1.651 m)     Head Cir --      Peak Flow --      Pain Score --      Pain Loc --      Pain Edu? --      Excl. in [place]? --     Constitutional: Alert and oriented. Cachectic gentleman in no acute distress Eyes: Conjunctivae are mildly icteric. PERRL. EOMI. Head: There is a small abrasion to the forehead. Nose: No congestion/rhinnorhea. Mouth/Throat: Mucous membranes are moist.  Oropharynx non-erythematous. Neck: No stridor.   Nontender with no meningismus Cardiovascular: Normal rate, regular rhythm. Grossly normal heart sounds.  Good peripheral circulation. Respiratory: Normal respiratory effort.  No retractions. Lungs CTAB.  Elevated respiratory rate noted Abdominal: Soft and nontender. No distention. No guarding no rebound Back:  There is no focal tenderness or step off there is no midline tenderness there are no lesions noted. there is no CVA tenderness Musculoskeletal: No lower extremity tenderness. No joint effusions, no DVT signs strong distal pulses no edema Neurologic:  Normal speech and  language. No gross focal neurologic deficits are appreciated.  Skin:  Skin is warm, dry and intact. No rash noted. Psychiatric: Mood and affect are normal. Speech and behavior are normal.  ____________________________________________   LABS (all labs ordered are listed, but only abnormal results are displayed)  Labs Reviewed  COMPREHENSIVE METABOLIC PANEL - Abnormal; Notable for the following:    Sodium 133 (*)    Chloride 96 (*)    Glucose, Bld 308 (*)    Creatinine, Ser 0.54 (*)    Albumin 2.5 (*)    AST 71 (*)    ALT 89 (*)    Alkaline Phosphatase 1145 (*)    Total Bilirubin 6.4 (*)    All other components within normal limits  CBC WITH DIFFERENTIAL/PLATELET - Abnormal; Notable for the following:    WBC 13.8 (*)    Hemoglobin 11.3 (*)    HCT 35.1 (*)    MCV 73.8 (*)    MCH 23.8 (*)    RDW 21.7 (*)    All other components within normal limits  LIPASE, BLOOD - Abnormal; Notable for the following:    Lipase 74 (*)    All other components within normal limits  ETHANOL  URINALYSIS COMPLETEWITH MICROSCOPIC ([facility] ONLY)  URINE DRUG SCREEN, QUALITATIVE ([facility] ONLY)  AMMONIA  LACTIC ACID, PLASMA  LACTIC ACID, PLASMA   ____________________________________________  EKG  I personally interpreted any EKGs ordered by me or triage Sinus tachycardia rate 121 no acute ST elevation or depression nonspecific ST 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 ____________________________________________   PROCEDURES  Procedure(s) performed: None  Critical Care performed: None  ____________________________________________   INITIAL IMPRESSION / ASSESSMENT AND PLAN / ED COURSE  Pertinent labs & imaging results that were available during my care of the patient were reviewed by me and considered in my medical decision making (see chart for details).  Patient rolled out of bed and is back to his baseline, however he is somewhat tachycardic and tachypnea is also noted. This certainly could be because of the Narcan administration of the patient is a chronic opiate user. There is no evidence of pulmonary edema on exam however patient's oxygen saturation saturations are somewhat low. Patient does however have a O2 requirement at home but he is noncompliant with it. We will obtain CT scan of the head diffuse blood work because of the question of altered mental status which seems to have resolved with Narcan and reassess.  Dr. Beather Arbour to f/u on testing, signed out at the end of my shift. ____________________________________________   FINAL CLINICAL IMPRESSION(S) / ED DIAGNOSES  Final diagnoses:  Fall      This chart was dictated using voice recognition software.  Despite best efforts to proofread,  errors can occur which can change meaning.     Schuyler Amor, MD 03/08/16 2316  Schuyler Amor, MD 03/08/16 [phone]

## 2016-03-08 NOTE — ED Notes (Signed)
Pt to ED via EMS from home c/o fall and overdose tonight.  Per EMS pt had unwitnessed fall tonight at home, abrassion noted to forehead.  Pt presents confused.  Pt has pancreatic cancer, HTN, and DBM, takes oxycodone and morphine at home.  Pt given 2mg  narcan en route.  EMS vitals 88% RA and placed on 4L up to 93%, 190/98, 100 HR, CBG 300.

## 2016-03-09 LAB — CBC WITH DIFFERENTIAL/PLATELET
BAND NEUTROPHILS: 6 %
BASOS ABS: 0 10*3/uL (ref 0–0.1)
BASOS PCT: 0 %
BLASTS: 0 %
Eosinophils Absolute: 0.1 10*3/uL (ref 0–0.7)
Eosinophils Relative: 1 %
HCT: 35.1 % — ABNORMAL LOW (ref 40.0–52.0)
HEMOGLOBIN: 11.3 g/dL — AB (ref 13.0–18.0)
LYMPHS ABS: 0.7 10*3/uL — AB (ref 1.0–3.6)
LYMPHS PCT: 5 %
MCH: 23.8 pg — AB (ref 26.0–34.0)
MCHC: 32.2 g/dL (ref 32.0–36.0)
MCV: 73.8 fL — ABNORMAL LOW (ref 80.0–100.0)
METAMYELOCYTES PCT: 0 %
Monocytes Absolute: 0.4 10*3/uL (ref 0.2–1.0)
Monocytes Relative: 3 %
Myelocytes: 0 %
NEUTROS ABS: 12.6 10*3/uL — AB (ref 1.4–6.5)
Neutrophils Relative %: 85 %
Other: 0 %
PLATELETS: 107 10*3/uL — AB (ref 150–440)
PROMYELOCYTES ABS: 0 %
RBC: 4.76 MIL/uL (ref 4.40–5.90)
RDW: 21.7 % — ABNORMAL HIGH (ref 11.5–14.5)
WBC: 13.8 10*3/uL — ABNORMAL HIGH (ref 3.8–10.6)
nRBC: 0 /100 WBC

## 2016-03-09 LAB — AMMONIA: Ammonia: 40 umol/L — ABNORMAL HIGH (ref 9–35)

## 2016-03-09 NOTE — ED Provider Notes (Signed)
-----------------------------------------   1:30 AM on 03/09/2016 -----------------------------------------  Updated family members of laboratory results indicative of liver failure. Reviewing the notes, patient has pancreatic cancer with hepatic metastases. Family states they sought a second opinion at [facility]; the oncologists there gave the family a prognosis of a few weeks of life; that was one month ago. Patient is somnolent and barely arousable. I had an extensive discussion with the family and explained the acute liver failure is a progression of patient's disease and he will likely go into multisystem organ failure and likely death within the next week or 2. Wife states patient had a living will and did not wish to be resuscitated. Given patient's terminal disease and impending death, we will try to get patient into hospice home tonight. I did offer ultrasound to further evaluate liver and/or hospitalization but did discuss with family this would not alter patient's grim prognosis. Family desired patient to be comfortable and do not wish to proceed with further testing.  ----------------------------------------- 2:41 AM on 03/09/2016 -----------------------------------------  Per wishes of patient and family, he is made a DO NOT RESUSCITATE. Hospice home has a bed and he will be transported via EMS.  Paulette Blanch, MD 03/09/16 [phone]

## 2016-03-09 NOTE — ED Notes (Signed)
Attempted to reach hospice nurse Kim at [phone], no answer. We will attempt again.

## 2016-03-09 NOTE — ED Notes (Addendum)
Spoke with Phubie at [facility] regarding admission to the facility. Phubie states pt has to be DNR. Pt expresses that he want DNR and family agreed. Phubie called back and states pt will be admitted to the facility to room 3. Pt will be transferred via [facility].

## 2016-03-09 NOTE — Discharge Instructions (Signed)
Nathaniel Graham has been made a DO NOT RESUSCITATE and desires comfort care measures only.  Hepatic Encephalopathy Hepatic encephalopathy is a loss of brain function from advanced liver disease. The effects of the condition depend on the type of liver damage and how severe it is. In some cases, hepatic encephalopathy can be reversed. CAUSES The exact cause of hepatic encephalopathy is not known. RISK FACTORS You have a higher risk of getting this condition if your liver is damaged. When the liver is damaged harmful substances called toxins can build up in the body. Certain toxins, such as ammonia, can harm your brain. Conditions that can cause liver damage include:  An infection.  Dehydration.  Intestinal bleeding.  Drinking too much alcohol.  Taking certain medicines, including tranquilizers, water pills (diuretics), antidepressants, or sleeping pills. SIGNS AND SYMPTOMS Signs and symptoms may develop suddenly. Or, they may develop slowly and get worse gradually. Symptoms can range from mild to severe. Mild Hepatic Encephalopathy  Mild confusion.  Personality and mood changes.  Anxiety and agitation.  Drowsiness.  Loss of mental abilities.  Musty or sweet-smelling breath. Worsening or Severe Hepatic Encephalopathy  Slowed movement.  Slurred speech.  Extreme personality changes.  Disorientation.  Abnormal shaking or flapping of the hands.  Coma. DIAGNOSIS To make a diagnosis, your health care provider will do a physical exam. To rule out other causes of your signs and symptoms, he or she may order tests. You may have:  Blood tests. These may be done to check your ammonia level, measure how long it takes your blood to clot, and check for infection.  Liver function tests. These may be done to check how well your liver is working.  MRI and CT scans. These may be done to check for a brain disorder.  Electroencephalogram (EEG). This may be done to measure the  electrical activity in your brain. TREATMENT The first step in treatment is identifying and treating possible triggers. The next step is involves taking medicine to lower the level of toxins in the body and to prevent ammonia from building up. You may need to take:  Antibiotics to reduce the ammonia-producing bacteria in your gut.  Lactulose to help flush ammonia from the gut. HOME CARE INSTRUCTIONS Eating and Drinking  Follow a low-protein diet that includes plenty of fruits, vegetables, and whole grains, as directed by your health care provider. Ammonia is produced when you digest high-protein foods.  Work with a Microbiologist or with your health care provider to make sure you are getting the right balance of protein and minerals.  Drink enough fluids to keep your urine clear or pale yellow. Drinking plenty of water helps prevent constipation.  Do not drink alcohol or use illegal drugs. Medicines  Only take medicine as directed by your health care provider.  If you were prescribed an antibiotic medicine, finish it all even if you start to feel better.  Do not start any new medicines, including over-the-counter medicines, without first checking with your health care provider. SEEK MEDICAL CARE IF:  You have new symptoms.  Your symptoms change.  Your symptoms get worse.  You have a fever.  You are constipated.  You have persistent nausea, vomiting, or diarrhea. SEEK IMMEDIATE MEDICAL CARE IF:  You become very confused or drowsy.  You vomit blood or material that looks like coffee grounds.  Your stool is bloody or black or looks like tar.   This information is not intended to replace advice given to you by your  health care provider. Make sure you discuss any questions you have with your health care provider.   Document Released: 01/08/2007 Document Revised: 11/19/2014 Document Reviewed: 06/16/2014 Elsevier Interactive Patient Education 2016 [employer]  Liver  Failure Liver failure is a condition in which the liver loses its ability to function due to injury or disease. The liver is a large organ in the upper right-hand side of the abdomen. It is involved in many important functions, including storing energy, producing fluids that the body needs, and removing harmful substances from the bloodstream. Liver failure can develop quickly, over days or weeks (acute liver failure). It can also develop gradually, over months or years (chronicliver failure).  CAUSES There are many possible causes of liver failure, including:  Liver infection (viral hepatitis).  Alcohol abuse.  An overdose of certain medicines. Acetaminophen overdose is a common cause.  Liver disease.  Ingesting poison from mushrooms or mold. SYMPTOMS  Symptoms of this condition may include:  Yellowing of the skin and the whites of the eyes (jaundice).  Bruising easily.  Persistent bleeding.  Itchy skin (pruritus).  Red, spider-like lines on the skin.  Loss of appetite.  Nausea.  Vomiting blood.  Bloody bowel movements.  Weight loss.  Muscle loss.  Jerky or floppy muscle movements, especially with hand movements (asterixis).  Fluid buildup in the belly (ascites).  Decreased urination. This may be a sign that your kidneys have stopped working (renal failure).  Confusion and sleepiness.  Difficulty sleeping.  Mood and personality changes.  Frequent infections.  Breath that smells sweet or musty.  Difficulty breathing. DIAGNOSIS  This condition is diagnosed based on your symptoms, your medical history, and a physical exam. You may have tests, including:  Blood tests.  CT scan.  MRI.  Ultrasound.  Removal of a small amount of liver tissue to be examined under a microscope (biopsy). TREATMENT  Treatment depends on the cause and severity of your condition. Treatment for chronic liver failure may include:  Medicines to help reduce  symptoms.  Lifestyle changes, such as limiting salt and animal proteins in your diet. Foods that contain animal proteins include red meat, fish, and dairy products. Acute or advanced (end stage) liver failure may require hospitalization. Treatment may include:  Antibiotic medicine.  IV fluids that contain sugar (glucose) and minerals (electrolytes).  Flushing out toxic substances from the body using medicine (lactulose) or a cleansing procedure (enema).  Adding certain amounts of the liquid part of blood (plasma) to your bloodstream (receiving a transfusion).  Using an artificial kidney to filter your blood (hemodialysis) if you have renal failure.  Breathing support and a breathing tube (respirator).  Liver transplant. This is a surgery to replace your liver with another person's liver (donor liver). This may be the best option if your liver has completely stopped functioning. HOME CARE INSTRUCTIONS  Take over-the-counter and prescription medicines only as told by your health care provider.  Do not drink alcohol.  Do not use tobacco products, including cigarettes, chewing tobacco, or e-cigarettes. If you need help quitting, ask your health care provider.  Follow instructions from your health care provider about eating and drinking restrictions. This may include:  Limiting the amount of animal protein that you eat.  Increasing the amount of plant-based protein that you eat. Foods that contain plant-based proteins include whole grains, nuts, and vegetables.  Taking vitamin supplements.  Limiting the amount of salt that you eat.  Follow instructions from your health care provider about maintaining your vaccinations,  especially vaccinations against hepatitis A and B.  Exercise regularly, as told by your health care provider.  Keep all follow-up visits as told by your health care provider. This is important. SEEK MEDICAL CARE IF:  You have symptoms that get worse.  You lose a  lot of weight without trying.  You have a fever or chills. SEEK IMMEDIATE MEDICAL CARE IF:  You become confused or very sleepy.  You cannot take care of yourself or be taken care of at home.  You are not urinating.  You have difficulty breathing.  You vomit blood.   This information is not intended to replace advice given to you by your health care provider. Make sure you discuss any questions you have with your health care provider.   Document Released: 07/20/2015 Document Reviewed: 11/17/2014 Elsevier Interactive Patient Education 2016 [place] [facility], Adult As a hospital patient, your condition and the treatments you receive can increase your risk for falls. Some additional risk factors for falls in a hospital include:  Being in an unfamiliar environment.  Being on bed rest.  Your surgery.  Taking certain medicines.  Your tubing requirements, such as intravenous (IV) therapy or catheters. It is important that you learn how to decrease fall risks while at the hospital. Below are important tips that can help prevent falls. SAFETY TIPS FOR PREVENTING FALLS Talk about your risk of falling.  Ask your health care provider why you are at risk for falling. Is it your medicine, illness, tubing placement, or something else?  Make a plan with your health care provider to keep you safe from falls.  Ask your health care provider or pharmacist about side effects of your medicines. Some medicines can make you dizzy or affect your coordination. Ask for help.  Ask for help before getting out of bed. You may need to press your call button.  Ask for assistance in getting safely to the toilet.  Ask for a walker or cane to be put at your bedside. Ask that most of the side rails on your bed be placed up before your health care provider leaves the room.  Ask family or friends to sit with you.  Ask for things that are out of your reach, such as your glasses,  hearing aids, telephone, bedside table, or call button. Follow these tips to avoid falling:  Stay lying or seated, rather than standing, while waiting for help.  Wear rubber-soled slippers or shoes whenever you walk in the hospital.  Avoid quick, sudden movements.  Change positions slowly.  Sit on the side of your bed before standing.  Stand up slowly and wait before you start to walk.  Let your health care provider know if there is a spill on the floor.  Pay careful attention to the medical equipment, electrical cords, and tubes around you.  When you need help, use your call button by your bed or in the bathroom. Wait for one of your health care providers to help you.  If you feel dizzy or unsure of your footing, return to bed and wait for assistance.  Avoid being distracted by the TV, telephone, or another person in your room.  Do not lean or support yourself on rolling objects, such as IV poles or bedside tables.   This information is not intended to replace advice given to you by your health care provider. Make sure you discuss any questions you have with your health care provider.   Document Released:  10/26/2000 Document Revised: 11/19/2014 Document Reviewed: 07/06/2012 Elsevier Interactive Patient Education 2016 [employer].  Pancreatic Cancer  Pancreatic cancer is an abnormal growth of tissue (tumor) in the pancreas that is cancerous (malignant). Unlike noncancerous (benign) tumors, malignant tumors can spread to other parts of your body. The pancreas is a gland located deep in the abdomen, between the stomach and the spine. The pancreas makes insulin and other hormones. These hormones help the body use or store the energy that comes from food. The pancreas also makes pancreatic juices. These juices contain enzymes that help digest food. Most pancreatic cancers begin in the ducts that carry pancreatic juices. When cancer of the pancreas spreads (metastasizes) outside the  pancreas, cancer cells are often found in nearby lymph nodes. If the cancer has reached these nodes, it means that cancer cells may have spread to other lymph nodes or other tissues, such as the liver or lungs. Sometimes cancer of the pancreas spreads to the peritoneum. This is the tissue that lines the abdomen. CAUSES  The exact cause of pancreatic cancer is unknown.  RISK FACTORS There are a number of risk factors that can increase your chances of getting pancreatic cancer. They include:  Age. The likelihood of developing pancreatic cancer increases with age. Most pancreatic cancers occur in people older than 60 years.  Smoking. Cigarette smokers are 2 to 3 times more likely than nonsmokers to develop pancreatic cancer.  Diabetes, especially if you were diagnosed as an adult.  Being male.  Being African American.  Family history. The risk for developing pancreatic cancer triples if a person's mother, father, sister, or brother had the disease. Also, a family history of colon cancer or ovarian cancer increases the risk of pancreatic cancer.  Chronic pancreatitis. Chronic pancreatitis is a painful condition of the pancreas.  Exposure to certain chemicals in the workplace.  Being obese or eating a diet high in fat and red meat. SYMPTOMS  Pancreatic cancer is sometimes called a "silent disease." This is because early pancreatic cancer often does not cause symptoms. As the cancer grows, symptoms may include:  Weakness.  Abdominal pain.  Diarrhea.  Depression.  Loss of appetite.  Indigestion.  Pain in the upper abdomen or upper back.  Nausea and vomiting.  Yellowing of the skin or eyes (jaundice).  Back pain.  Weight loss.  Fatigue.  Clay-colored stools.  Unexplained blood clots.  Dark urine. DIAGNOSIS  Your caregiver will ask about your medical history. He or she may also perform a number of procedures, such as:  A physical exam. Your skin and eyes will be  examined for signs of jaundice. The abdomen will be checked for changes in the area near the pancreas, liver, and gallbladder. Your caregiver will also check for an abnormal buildup of fluid in the abdomen (ascites).  Lab tests. Your caregiver may take blood and urine samples to check for bilirubin and other substances. Bilirubin is a substance that passes from the liver to the intestine through the gallbladder and bile duct. If the bile duct is blocked by a tumor, the bilirubin cannot pass through normally. Blockage of the bile duct may cause the level of bilirubin in the blood and urine to become very high.  Computed tomography (CT). An X-ray machine linked to a computer takes a series of detailed pictures of the pancreas and other organs and blood vessels in the abdomen.  Ultrasonography. The ultrasound device uses sound waves to produce pictures of the pancreas and other organs inside  the abdomen.  Endoscopic retrograde cholangiopancreatography. A lighted tube (endoscope) is passed through the patient's mouth and stomach, down into the small intestine. Then a smaller tube (catheter) is inserted through the endoscope into the bile ducts and pancreatic ducts. A dye is injected through the catheter into the ducts and X-rays are taken. The X-rays can show whether the ducts are narrowed or blocked by a tumor.  Endoscopic ultrasonography. An endoscope is passed through the patient's mouth and stomach, down into the small intestine. An ultrasound device is placed down the endoscope to produce pictures of the area, including the pancreas.  Percutaneous transhepatic cholangiography. A dye is injected through a thin needle into the liver and X-rays are taken. Unless there is a blockage, the dye should move freely through the bile ducts. From the X-rays, your caregiver can tell whether there is a blockage from a tumor.  Taking a tissue sample (biopsy) from the pancreas. The sample is examined under a  microscope to look for cancer cells. Your cancer will be staged according to its severity and extent. Staging is a careful attempt to categorize your cancer to help determine which treatment will be most appropriate. Factors involved in staging include the size of the tumor, whether the cancer has spread, and if so, to what parts of the body it has spread. You may need to have more tests to determine the stage of your cancer. The test results will help determine what treatment plan is best for you. STAGES   Stage I. The cancer is only found in the pancreas.  Stage II. The cancer has spread to nearby tissues and possibly to the lymph nodes, but not to the blood vessels.  Stage III. The cancer is surrounding the major blood vessels beside the pancreas and has possibly spread to the lymph nodes.  Stage IV. The cancer has spread to other parts of the body, such as the liver, lungs, or peritoneum. TREATMENT  Treatment generally begins within several weeks after the diagnosis. There will be time to talk with your caregiver about treatment choices, get a second opinion, and learn more about the disease. Your caregiver may refer you to a cancer specialist (oncologist).  Cancer of the pancreas is very hard to control with current treatments. For that reason, many caregivers encourage patients with this disease to consider taking part in a clinical trial. Clinical trials are an important option for people with all stages of pancreatic cancer.  At this time, pancreatic cancer can be cured only when it is found at an early stage, before it has spread. However, other treatments may be able to control the disease and help patients live longer and feel better. When a cure or control of the disease is not possible, some patients choose palliative therapy. Palliative therapy aims to improve quality of life by controlling pain and other problems caused by this disease, but it does not cure the disease. Depending on  the type and stage, pancreatic cancer may be treated with surgery, radiation therapy, or chemotherapy. Some patients have a combination of these therapies.  Surgery may be done to remove all or part of the pancreas. Sometimes the cancer cannot be completely removed. However, if the tumor is blocking the common bile duct or duodenum, the surgeon can place a mesh tube (stent) in the blocked area. This helps keep the duct or duodenum open.  Radiation therapy uses high-energy rays to kill cancer cells.  Chemotherapy is the use of drugs to kill cancer  cells. Caregivers also give chemotherapy to help reduce pain and other problems caused by pancreatic cancer. HOME CARE INSTRUCTIONS   Only take over-the-counter or prescription medicines for pain, discomfort, or fever as directed by your caregiver.  Maintain a healthy diet.  Consider joining a support group. This may help you learn to cope with the stress of having pancreatic cancer.  Seek advice to help you manage treatment side effects.  Keep all follow-up appointments as directed by your caregiver. SEEK IMMEDIATE MEDICAL CARE IF:   You have a sudden increase in pain.  Your skin or eyes turn more yellow.  You lose weight without trying.  You have a fever, especially during chemotherapy treatment or after stent placement.  You notice new fatigue or weakness.   This information is not intended to replace advice given to you by your health care provider. Make sure you discuss any questions you have with your health care provider.   Document Released: 10/13/2004 Document Revised: 11/19/2014 Document Reviewed: 11/23/2011 Elsevier Interactive Patient Education [employer].

## 2016-03-22 ENCOUNTER — Emergency Department
Admission: EM | Admit: 2016-03-22 | Discharge: 2016-03-22 | Disposition: A | Discharge status: Home / Self Care | Attending: Emergency Medicine | Admitting: Emergency Medicine

## 2016-03-22 ENCOUNTER — Emergency Department

## 2016-03-22 ENCOUNTER — Encounter: Payer: Self-pay | Admitting: Emergency Medicine

## 2016-03-22 DIAGNOSIS — Y999 Unspecified external cause status: Secondary | ICD-10-CM | POA: Insufficient documentation

## 2016-03-22 DIAGNOSIS — I10 Essential (primary) hypertension: Secondary | ICD-10-CM | POA: Diagnosis not present

## 2016-03-22 DIAGNOSIS — Z79891 Long term (current) use of opiate analgesic: Secondary | ICD-10-CM | POA: Diagnosis not present

## 2016-03-22 DIAGNOSIS — Y929 Unspecified place or not applicable: Secondary | ICD-10-CM | POA: Insufficient documentation

## 2016-03-22 DIAGNOSIS — S0990XA Unspecified injury of head, initial encounter: Secondary | ICD-10-CM | POA: Insufficient documentation

## 2016-03-22 DIAGNOSIS — E119 Type 2 diabetes mellitus without complications: Secondary | ICD-10-CM | POA: Diagnosis not present

## 2016-03-22 DIAGNOSIS — Z8507 Personal history of malignant neoplasm of pancreas: Secondary | ICD-10-CM | POA: Diagnosis not present

## 2016-03-22 DIAGNOSIS — Y939 Activity, unspecified: Secondary | ICD-10-CM | POA: Diagnosis not present

## 2016-03-22 DIAGNOSIS — Z79899 Other long term (current) drug therapy: Secondary | ICD-10-CM | POA: Insufficient documentation

## 2016-03-22 DIAGNOSIS — Z7984 Long term (current) use of oral hypoglycemic drugs: Secondary | ICD-10-CM | POA: Insufficient documentation

## 2016-03-22 DIAGNOSIS — W182XXA Fall in (into) shower or empty bathtub, initial encounter: Secondary | ICD-10-CM | POA: Diagnosis not present

## 2016-03-22 DIAGNOSIS — F17210 Nicotine dependence, cigarettes, uncomplicated: Secondary | ICD-10-CM | POA: Diagnosis not present

## 2016-03-22 NOTE — ED Provider Notes (Signed)
Time Seen: Approximately 1205 I have reviewed the triage notes  Chief Complaint: Fall   History of Present Illness: Nathaniel Graham. is a 70 y.o. male who has a history of end-stage pancreatic cancer and is currently under hospice care. Patient has a chronic altered mental status secondary to his disease. Patient apparently fell last night? It is head and the type. EMS was apparently notified the patient refused transport at that time. The patient states that he has no physical complaints and that he remembers the fall and simply wishes to go back home at this point.  He denies any new physical complaints. He's states that the Foley catheter that he has causes him discomfort but this is not a new problem. Patient's already awake alert and oriented 2 is vague on the actual date. Patient has previously been here very somnolent and difficult to arouse but is certainly awake and alert at this time. Patient was referred by hospice and through the girlfriend to come to the emergency department for evaluation.   Past Medical History  Diagnosis Date  . Hypertension   . Diabetes mellitus without complication ([place])   . Iron deficiency anemia   . Blood dyscrasia   . Intractable hiccups 01/02/2016  . Thrombus     in right IJ after medport placement in Feb 2017  . Cancer [facility])     Patient Active Problem List   Diagnosis Date Noted  . Protein-calorie malnutrition, severe 01/25/2016  . Malignant neoplasm of tail of pancreas ([place])   . GI bleed 01/24/2016  . Blood loss anemia 01/24/2016  . Intractable hiccups 01/02/2016  . Pancreatic cancer ([place]) 12/21/2015  . Iron deficiency anemia 12/21/2015    Past Surgical History  Procedure Laterality Date  . Upper esophageal endoscopic ultrasound (eus) N/A 12/15/2015    Procedure: UPPER ESOPHAGEAL ENDOSCOPIC ULTRASOUND (EUS);  Surgeon: Cora Daniels, MD;  Location: [facility] ENDOSCOPY;  Service: Endoscopy;  Laterality: N/A;  . Cataract surgery  Bilateral   . Peripheral vascular catheterization N/A 12/26/2015    Procedure: Porta Cath Insertion;  Surgeon: Algernon Huxley, MD;  Location: [place] CV LAB;  Service: Cardiovascular;  Laterality: N/A;  . Esophagogastroduodenoscopy N/A 01/25/2016    Procedure: ESOPHAGOGASTRODUODENOSCOPY (EGD);  Surgeon: Manya Silvas, MD;  Location: [facility] ENDOSCOPY;  Service: Endoscopy;  Laterality: N/A;  zvm's cauterized with erbe    Past Surgical History  Procedure Laterality Date  . Upper esophageal endoscopic ultrasound (eus) N/A 12/15/2015    Procedure: UPPER ESOPHAGEAL ENDOSCOPIC ULTRASOUND (EUS);  Surgeon: Cora Daniels, MD;  Location: [facility] ENDOSCOPY;  Service: Endoscopy;  Laterality: N/A;  . Cataract surgery Bilateral   . Peripheral vascular catheterization N/A 12/26/2015    Procedure: Porta Cath Insertion;  Surgeon: Algernon Huxley, MD;  Location: [place] CV LAB;  Service: Cardiovascular;  Laterality: N/A;  . Esophagogastroduodenoscopy N/A 01/25/2016    Procedure: ESOPHAGOGASTRODUODENOSCOPY (EGD);  Surgeon: Manya Silvas, MD;  Location: [facility] ENDOSCOPY;  Service: Endoscopy;  Laterality: N/A;  zvm's cauterized with erbe    Current Outpatient Rx  Name  Route  Sig  Dispense  Refill  . chlorproMAZINE (THORAZINE) 25 MG tablet   Oral   Take 1 tablet (25 mg total) by mouth 4 (four) times daily as needed.   30 tablet   1     Nathaniel Graham, social worker approved copay to be pl ...   . cholecalciferol (VITAMIN D) 1000 units tablet   Oral   Take 1,000 Units  by mouth daily.         . ferrous fumarate (HEMOCYTE - 106 MG FE) 325 (106 Fe) MG TABS tablet   Oral   Take 1 tablet by mouth daily.          Marland Kitchen glimepiride (AMARYL) 1 MG tablet   Oral   Take 2 tablets by mouth 2 (two) times daily.          Marland Kitchen lisinopril (PRINIVIL,ZESTRIL) 10 MG tablet   Oral   Take 10 mg by mouth daily.         . metFORMIN (GLUCOPHAGE) 1000 MG tablet   Oral   Take 1,000 mg by mouth 2 (two) times daily  with a meal.         . morphine (MS CONTIN) 30 MG 12 hr tablet   Oral   Take 1 tablet (30 mg total) by mouth every 12 (twelve) hours.   60 tablet   0     Hospice pt.   . oxyCODONE-acetaminophen (PERCOCET) 7.5-325 MG tablet   Oral   Take 1 tablet by mouth every 8 (eight) hours as needed.      0   . pantoprazole (PROTONIX) 40 MG tablet   Oral   Take 40 mg by mouth daily.         . polyethylene glycol powder (GLYCOLAX/MIRALAX) powder   Oral   Take 17 g by mouth as needed. FOR CONSTIPATION         . simvastatin (ZOCOR) 10 MG tablet   Oral   Take 10 mg by mouth daily at 6 PM.            Allergies:  Review of patient's allergies indicates no known allergies.  Family History: Family History  Problem Relation Age of Onset  . Diabetes Mellitus II Mother   . Osteoarthritis Father     Social History: Social History  Substance Use Topics  . Smoking status: Current Every Day Smoker -- 0.25 packs/day for 30 years    Types: Cigarettes  . Smokeless tobacco: Never Used     Comment: smokes 3-4 cigarettes a day "STARTED SMOKING LATE TEENS"  . Alcohol Use: No     Review of Systems:   10 point review of systems was performed and was otherwise negative:  Constitutional: No fever Eyes: No visual disturbances ENT: No sore throat, ear pain Cardiac: No chest pain Respiratory: No shortness of breath, wheezing, or stridor Abdomen: No abdominal pain, no vomiting, No diarrhea Endocrine: No weight loss, No night sweats Extremities: No peripheral edema, cyanosis Skin: No rashes, easy bruising Neurologic: No focal weakness, trouble with speech or swollowing Urologic: No dysuria, Hematuria, or urinary frequency   Physical Exam:  ED Triage Vitals  Enc Vitals Group     BP 03/22/16 1053 142/70 mmHg     Pulse Rate 03/22/16 1053 110     Resp 03/22/16 1053 16     Temp 03/22/16 1053 97.7 F (36.5 C)     Temp Source 03/22/16 1053 Oral     SpO2 03/22/16 1053 89 %      Weight 03/22/16 1053 142 lb (64.411 kg)     Height 03/22/16 1053 5\' 4"  (1.626 m)     Head Cir --      Peak Flow --      Pain Score 03/22/16 1059 0     Pain Loc --      Pain Edu? --      Excl. in [place]? --  General: Awake , Alert , and Oriented times2, cooperative. He does remember the fall. Glasgow Coma Scale 15 and answers most questions appropriately except for the date. Head: Normal cephalic , atraumatic Eyes: Pupils equal , round, reactive to light Nose/Throat: No nasal drainage, patent upper airway without erythema or exudate.  Neck: Supple, Full range of motion, No anterior adenopathy or palpable thyroid masses Lungs: Clear to ascultation without wheezes , rhonchi, or rales Heart: Regular rate, regular rhythm without murmurs , gallops , or rubs Abdomen: Soft, non tender without rebound, guarding , or rigidity; bowel sounds positive and symmetric in all 4 quadrants. No organomegaly .        Extremities: 2 plus symmetric pulses. No edema, clubbing or cyanosis Neurologic: normal ambulation, Motor symmetric without deficits, sensory intact Skin: warm, dry, no rashes Foley catheter present and appears to have a small amount of drainage  Labs:   All laboratory work was reviewed including any pertinent negatives or positives listed below:  Labs Reviewed  Study [place] ([place])  Attempts at establishing a urinalysis by the nursing staff took a period of time and patient is insistent he just wants to go home at this time.   Radiology:   EXAM: CT HEAD WITHOUT CONTRAST  TECHNIQUE: Contiguous axial images were obtained from the base of the skull through the vertex without intravenous contrast.  COMPARISON: Head CT 01/30/2014 and 03/08/2016.  FINDINGS: Brain: There is no evidence of acute intracranial hemorrhage, mass lesion, brain edema or extra-axial fluid collection. The ventricles and subarachnoid spaces are prominent but stable. All there  is stable moderate periventricular white matter disease. No evidence of acute infarct. Intracranial vascular calcifications are present.  Bones/sinuses/visualized face: The visualized paranasal sinuses, mastoid air cells and middle ears are clear. Large midline occipital scalp lipoma appears unchanged. There is a small lipoma in the left frontal scalp. The calvarium is intact.  IMPRESSION: No acute intracranial findings. Stable atrophy, chronic small vessel ischemic changes and scalp lipomas.   Electronically Signed By: Richardean Sale M.D. On: 03/22/2016 12:13         I personally reviewed the radiologic studies   P  ED Course:  Patient's stay was uneventful and his head CT showed no significant findings. Cannot find any other obvious trauma from his fall. Patient denies any focal injuries and was to have no other further assessment. He has a known history of end-stage pancreatic cancer and is currently under hospice care.    Assessment:  Status post fall with possible head trauma   Final Clinical Impression:   Final diagnoses:  Head trauma, initial encounter     Plan:  Outpatient management Patient was advised to return immediately if condition worsens. Patient was advised to follow up with their primary care physician or other specialized physicians involved in their outpatient care. The patient and/or family member/power of attorney had laboratory results reviewed at the bedside. All questions and concerns were addressed and appropriate discharge instructions were distributed by the nursing staff.             Daymon Larsen, MD 03/22/16 [phone]

## 2016-03-22 NOTE — ED Notes (Signed)
Pt presents to ED after falling in bathtub last night. Pt girlfriend reports EMS was called last night but pt refused transport. Pt denies pain but girlfriend would like pt to be evaluated. Pt is under hospice treatment for pancreatic cancer.

## 2016-03-22 NOTE — Discharge Instructions (Signed)
Head Injury, Adult You have a head injury. Headaches and throwing up (vomiting) are common after a head injury. It should be easy to wake up from sleeping. Sometimes you must stay in the hospital. Most problems happen within the first 24 hours. Side effects may occur up to 7-10 days after the injury.  WHAT ARE THE TYPES OF HEAD INJURIES? Head injuries can be as minor as a bump. Some head injuries can be more severe. More severe head injuries include:  A jarring injury to the brain (concussion).  A bruise of the brain (contusion). This mean there is bleeding in the brain that can cause swelling.  A cracked skull (skull fracture).  Bleeding in the brain that collects, clots, and forms a bump (hematoma). WHEN SHOULD I GET HELP RIGHT AWAY?   You are confused or sleepy.  You cannot be woken up.  You feel sick to your stomach (nauseous) or keep throwing up (vomiting).  Your dizziness or unsteadiness is getting worse.  You have very bad, lasting headaches that are not helped by medicine. Take medicines only as told by your doctor.  You cannot use your arms or legs like normal.  You cannot walk.  You notice changes in the black spots in the center of the colored part of your eye (pupil).  You have clear or bloody fluid coming from your nose or ears.  You have trouble seeing. During the next 24 hours after the injury, you must stay with someone who can watch you. This person should get help right away (call 911 in the [place] if you start to shake and are not able to control it (have seizures), you pass out, or you are unable to wake up. HOW CAN I PREVENT A HEAD INJURY IN THE FUTURE?  Wear seat belts.  Wear a helmet while bike riding and playing sports like football.  Stay away from dangerous activities around the house. WHEN CAN I RETURN TO NORMAL ACTIVITIES AND ATHLETICS? See your doctor before doing these activities. You should not do normal activities or play contact sports until 1  week after the following symptoms have stopped:  Headache that does not go away.  Dizziness.  Poor attention.  Confusion.  Memory problems.  Sickness to your stomach or throwing up.  Tiredness.  Fussiness.  Bothered by bright lights or loud noises.  Anxiousness or depression.  Restless sleep. MAKE SURE YOU:   Understand these instructions.  Will watch your condition.  Will get help right away if you are not doing well or get worse.   This information is not intended to replace advice given to you by your health care provider. Make sure you discuss any questions you have with your health care provider.   Document Released: 10/11/2008 Document Revised: 11/19/2014 Document Reviewed: 07/06/2013 Elsevier Interactive Patient Education [employer].  Please return immediately if condition worsens. Please contact her primary physician or the physician you were given for referral. If you have any specialist physicians involved in her treatment and plan please also contact them. Thank you for using [facility].

## 2016-03-22 NOTE — Progress Notes (Signed)
Notified by patient's home care RN that he had fallen last evening and his care giver/girlfriend was taking him to the [facility] ED evaluation.He is currently followed by [facility] [place] at home with a hospice diagnosis of Pancreatic cancer.  Chart notes reviewed head CT was performed, no acute findings noted. Patient to be discharged home. Patient seen in the ED, alert and interactive, anxious to leave. Caregiver Rosa at bedside. She expressed her frustration regarding Mr. Nathaniel Graham continued resistance to listen to her about getting up and also about smoking. Emotional support offered. Encouraged Rosa to use her hospice team for assistance. Hospice team alerted to discharge. Thank you. Flo Shanks RN, BSN, [facility], [facility] [phone] c

## 2016-04-12 DEATH — deceased

## 2016-09-23 IMAGING — DX DG CHEST 1V PORT
1 series · 1 of 1 positions shown · non-contrast
Comparison: Chest radiograph performed 01/30/2014, and CT of the
chest performed 11/28/2015

CLINICAL DATA: Status post unwitnessed fall. Confusion. Initial
encounter.

EXAM:
PORTABLE CHEST 1 VIEW

[chest ap]
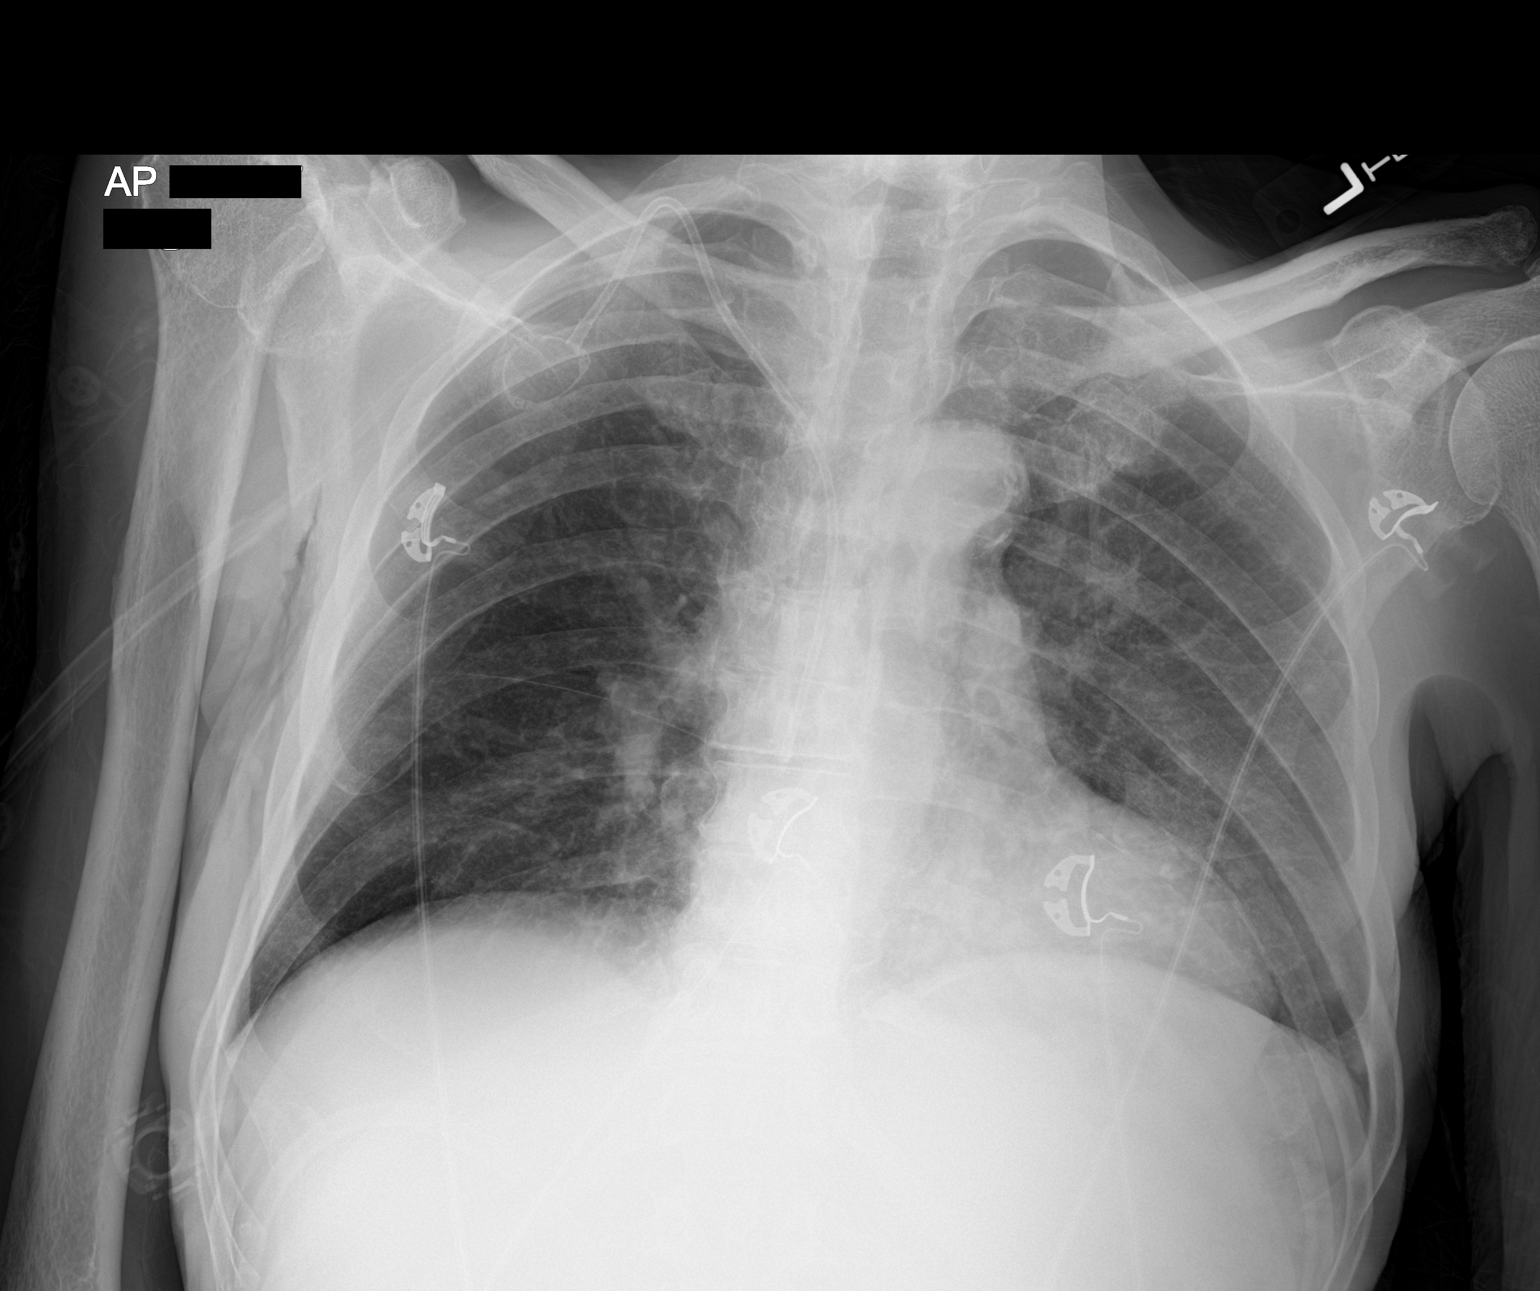

[1 of 1 positions shown; findings below may reference images not displayed]

FINDINGS: The lungs are well-aerated and clear. There is no evidence of focal
opacification, pleural effusion or pneumothorax.

The cardiomediastinal silhouette is within normal limits. A
right-sided chest port is noted ending about the distal SVC. No
acute osseous abnormalities are seen.
IMPRESSION: No acute cardiopulmonary process seen. No displaced rib fractures
identified.

## 2016-10-07 IMAGING — CT CT HEAD W/O CM
1 of 2 series · 13 of 30 positions shown, 17 images · non-contrast
Comparison: Head CT 01/30/2014 and 03/08/2016.

CLINICAL DATA: Fall in bathtub last night. Undergoing hospice care
for pancreatic cancer.

EXAM:
CT HEAD WITHOUT CONTRAST
TECHNIQUE: Contiguous axial images were obtained from the base of the skull
through the vertex without intravenous contrast.

[Series 2: head wo · axial · 0.42mm/px · z∈[-20,+100]mm · 13 of 28 slices shown, 17 images]
[im 2/28  brain]
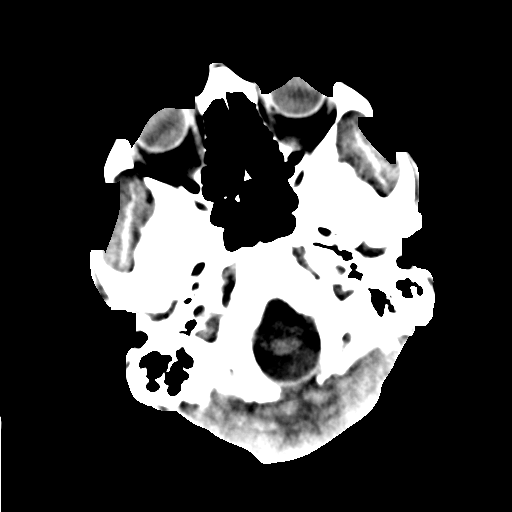
[im 2/28  bone]
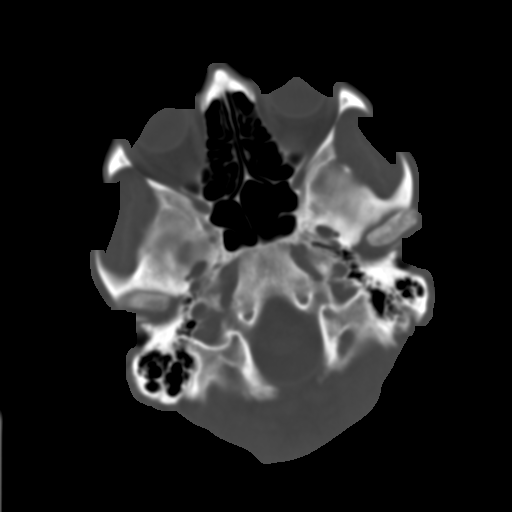
[im 4/28  brain]
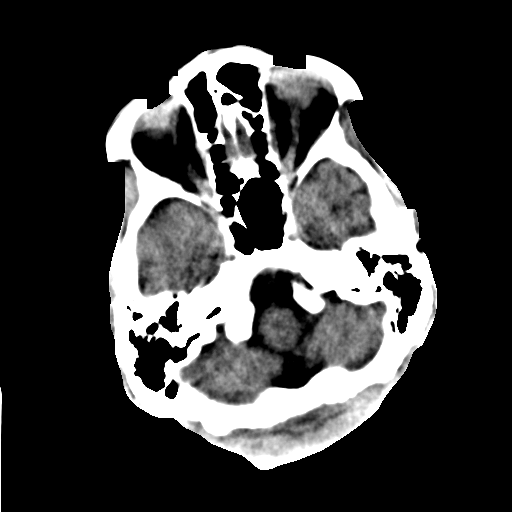
[im 6/28  brain]
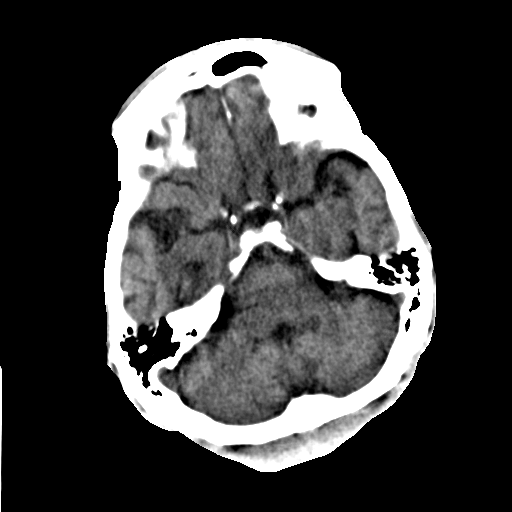
[im 8/28  brain]
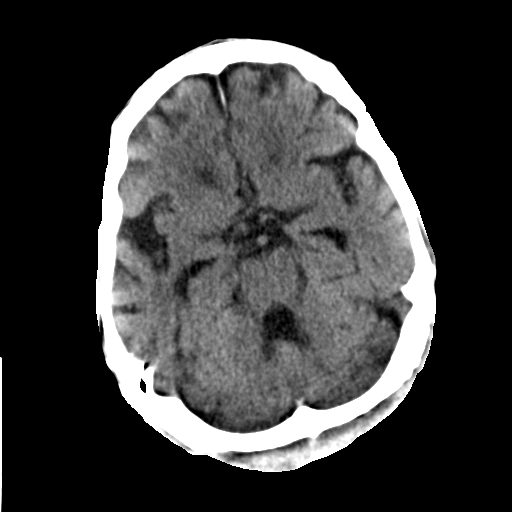
[im 10/28  brain]
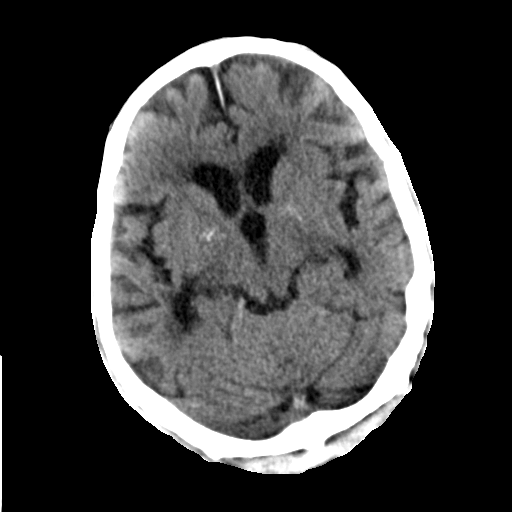
[im 10/28  bone]
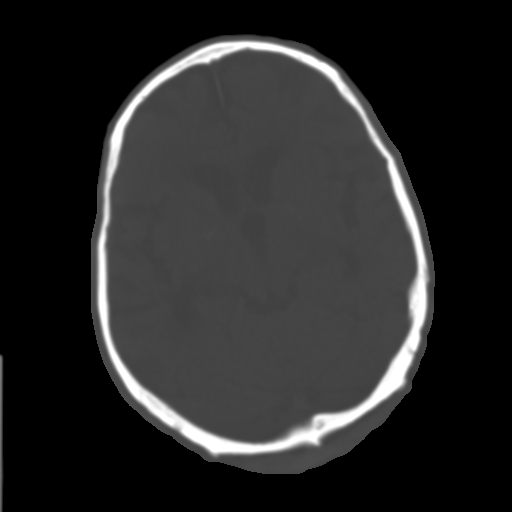
[im 12/28  brain]
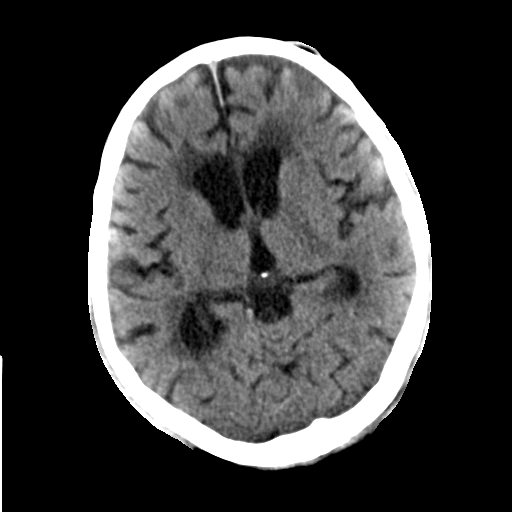
[im 14/28  brain]
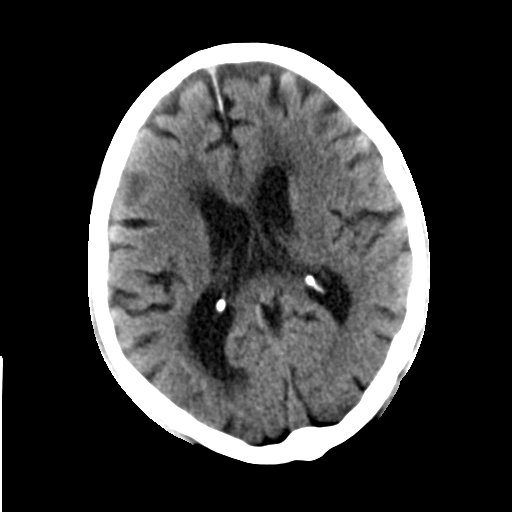
[im 16/28  brain]
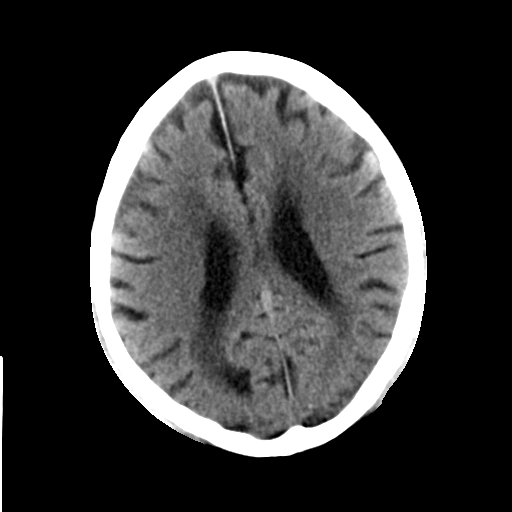
[im 18/28  brain]
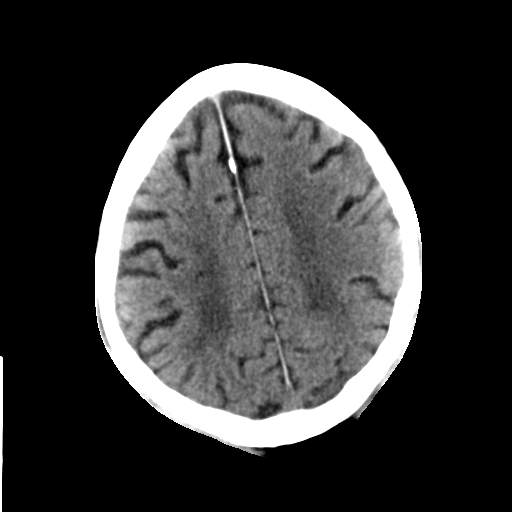
[im 18/28  bone]
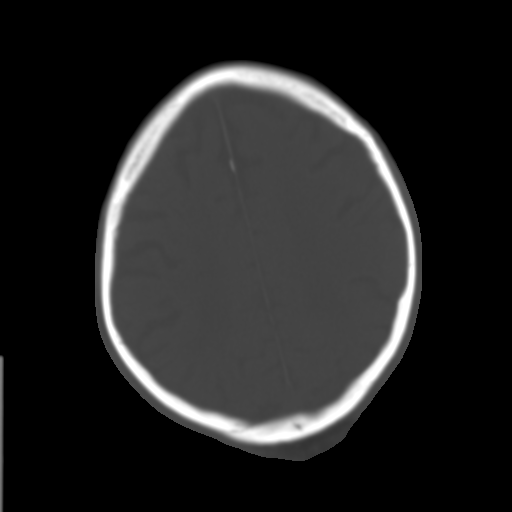
[im 20/28  brain]
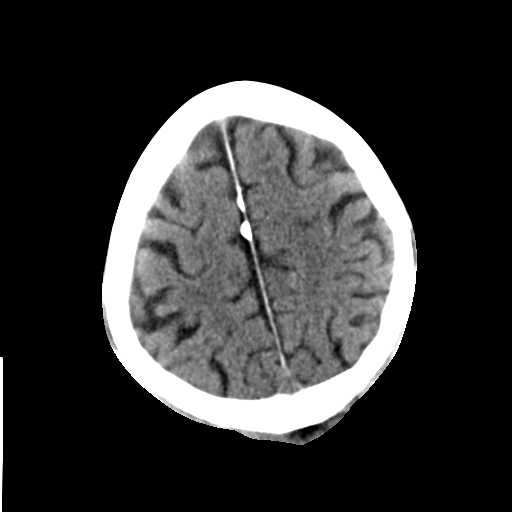
[im 22/28  brain]
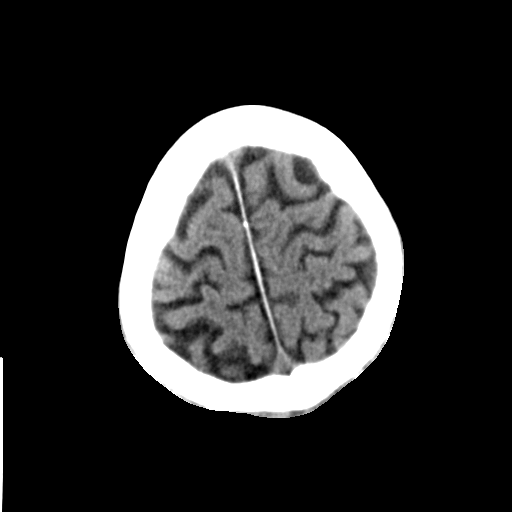
[im 24/28  brain]
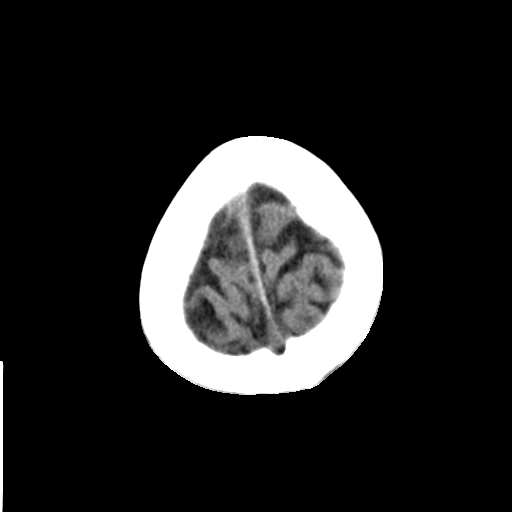
[im 26/28  brain]
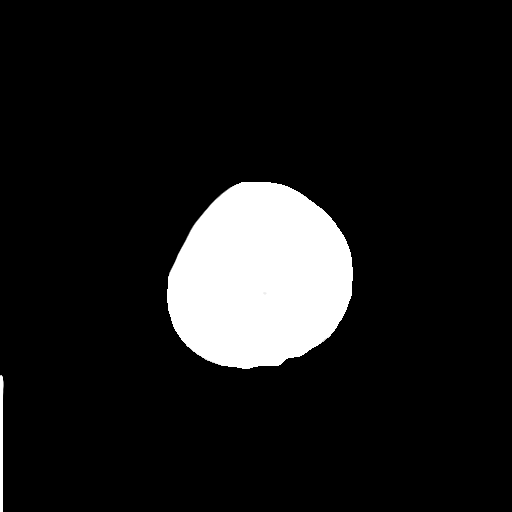
[im 26/28  bone]
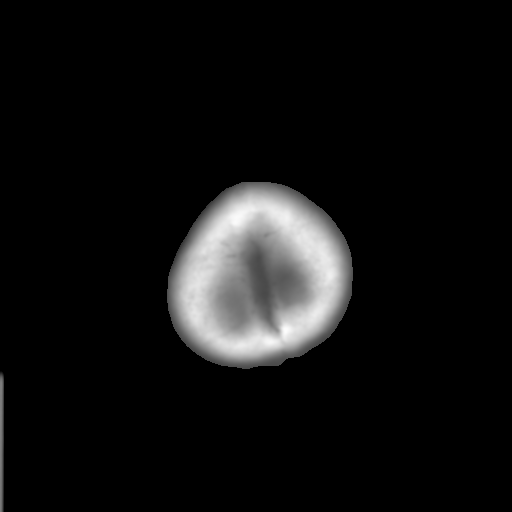

[13 of 30 positions shown; findings below may reference images not displayed]

FINDINGS: Brain: There is no evidence of acute intracranial hemorrhage, mass
lesion, brain edema or extra-axial fluid collection. The ventricles
and subarachnoid spaces are prominent but stable. All there is
stable moderate periventricular white matter disease. No evidence of
acute infarct. Intracranial vascular calcifications are present.

Bones/sinuses/visualized face: The visualized paranasal sinuses,
mastoid air cells and middle ears are clear. Large midline occipital
scalp lipoma appears unchanged. There is a small lipoma in the left
frontal scalp. The calvarium is intact.
IMPRESSION: No acute intracranial findings. Stable atrophy, chronic small vessel
ischemic changes and scalp lipomas.

## 2016-10-20 ENCOUNTER — Other Ambulatory Visit: Payer: Self-pay | Admitting: Nurse Practitioner
